# Patient Record
Sex: Female | Born: 2000 | Race: Black or African American | Hispanic: No | Marital: Single | Smoking: Never smoker
Health system: Southern US, Community
[De-identification: ages and names within clinical notes are randomized; demographics above are authoritative.]

## PROBLEM LIST (undated history)

## (undated) DIAGNOSIS — F419 Anxiety disorder, unspecified: Secondary | ICD-10-CM

## (undated) DIAGNOSIS — F329 Major depressive disorder, single episode, unspecified: Secondary | ICD-10-CM

## (undated) DIAGNOSIS — F32A Depression, unspecified: Secondary | ICD-10-CM

---
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
## 2020-05-20 ENCOUNTER — Emergency Department (HOSPITAL_COMMUNITY)
Admission: EM | Admit: 2020-05-20 | Discharge: 2020-05-20 | Disposition: A | Discharge status: Home / Self Care | Payer: BC Managed Care – PPO | Attending: Emergency Medicine | Admitting: Emergency Medicine

## 2020-05-20 ENCOUNTER — Encounter (HOSPITAL_COMMUNITY): Payer: Self-pay

## 2020-05-20 ENCOUNTER — Emergency Department (HOSPITAL_COMMUNITY): Payer: BC Managed Care – PPO

## 2020-05-20 ENCOUNTER — Other Ambulatory Visit: Payer: Self-pay

## 2020-05-20 DIAGNOSIS — T43011A Poisoning by tricyclic antidepressants, accidental (unintentional), initial encounter: Secondary | ICD-10-CM | POA: Diagnosis not present

## 2020-05-20 DIAGNOSIS — T43591A Poisoning by other antipsychotics and neuroleptics, accidental (unintentional), initial encounter: Secondary | ICD-10-CM | POA: Insufficient documentation

## 2020-05-20 DIAGNOSIS — R0602 Shortness of breath: Secondary | ICD-10-CM

## 2020-05-20 DIAGNOSIS — T50901A Poisoning by unspecified drugs, medicaments and biological substances, accidental (unintentional), initial encounter: Secondary | ICD-10-CM

## 2020-05-20 LAB — COMPREHENSIVE METABOLIC PANEL
ALT: 16 U/L (ref 0–44)
AST: 21 U/L (ref 15–41)
Albumin: 4 g/dL (ref 3.5–5.0)
Alkaline Phosphatase: 27 U/L — ABNORMAL LOW (ref 38–126)
Anion gap: 13 (ref 5–15)
BUN: 12 mg/dL (ref 6–20)
CO2: 19 mmol/L — ABNORMAL LOW (ref 22–32)
Calcium: 9 mg/dL (ref 8.9–10.3)
Chloride: 106 mmol/L (ref 98–111)
Creatinine, Ser: 0.65 mg/dL (ref 0.44–1.00)
GFR, Estimated: >60 mL/min (ref >60)
Glucose, Bld: 107 mg/dL — ABNORMAL HIGH (ref 70–99)
Potassium: 2.8 mmol/L — ABNORMAL LOW (ref 3.5–5.1)
Sodium: 138 mmol/L (ref 135–145)
Total Bilirubin: 1 mg/dL (ref 0.3–1.2)
Total Protein: 7.2 g/dL (ref 6.5–8.1)

## 2020-05-20 LAB — CBC
HCT: 36.5 % (ref 36.0–46.0)
Hemoglobin: 11.4 g/dL — ABNORMAL LOW (ref 12.0–15.0)
MCH: 26.6 pg (ref 26.0–34.0)
MCHC: 31.2 g/dL (ref 30.0–36.0)
MCV: 85.3 fL (ref 80.0–100.0)
Platelets: 228 10*3/uL (ref 150–400)
RBC: 4.28 MIL/uL (ref 3.87–5.11)
RDW: 13.1 % (ref 11.5–15.5)
WBC: 6 10*3/uL (ref 4.0–10.5)
nRBC: 0 % (ref 0.0–0.2)

## 2020-05-20 LAB — MAGNESIUM: Magnesium: 2.2 mg/dL (ref 1.7–2.4)

## 2020-05-20 LAB — I-STAT BETA HCG BLOOD, ED (MC, WL, AP ONLY): I-stat hCG, quantitative: <5 m[IU]/mL (ref <5)

## 2020-05-20 MED ORDER — ONDANSETRON HCL 4 MG/2ML IJ SOLN
4.0000 mg | Freq: Once | INTRAMUSCULAR | Status: AC
  Administered 2020-05-20: 4 mg via INTRAVENOUS
  Filled 2020-05-20: qty 2

## 2020-05-20 MED ORDER — ALUM & MAG HYDROXIDE-SIMETH 200-200-20 MG/5ML PO SUSP
30.0000 mL | Freq: Once | ORAL | Status: AC
  Administered 2020-05-20: 30 mL via ORAL
  Filled 2020-05-20: qty 30

## 2020-05-20 MED ORDER — LACTATED RINGERS IV BOLUS
1000.0000 mL | Freq: Once | INTRAVENOUS | Status: AC
  Administered 2020-05-20: 1000 mL via INTRAVENOUS

## 2020-05-20 MED ORDER — SODIUM CHLORIDE 0.9 % IV BOLUS
500.0000 mL | Freq: Once | INTRAVENOUS | Status: AC
  Administered 2020-05-20: 500 mL via INTRAVENOUS

## 2020-05-20 MED ORDER — POTASSIUM CHLORIDE 20 MEQ PO PACK
40.0000 meq | PACK | Freq: Two times a day (BID) | ORAL | Status: DC
  Administered 2020-05-20: 40 meq via ORAL
  Filled 2020-05-20: qty 2

## 2020-05-20 MED ORDER — LIDOCAINE VISCOUS HCL 2 % MT SOLN
15.0000 mL | Freq: Once | OROMUCOSAL | Status: AC
  Administered 2020-05-20: 15 mL via ORAL
  Filled 2020-05-20: qty 15

## 2020-05-20 MED ORDER — POTASSIUM CHLORIDE ER 10 MEQ PO TBCR: 10.0000 meq | EXTENDED_RELEASE_TABLET | Freq: Every day | ORAL | 0 refills | Status: DC

## 2020-05-20 NOTE — ED Triage Notes (Signed)
Pt reports she took 2 seroquel tablets and 6 50mg  trazodone tablets around 345pm today to try and help her sleep. Pt now c.o feeling like her heart is racing, is feeling dizzy and her stomach is burning. Pt denies SI.  Denise from poison control recommends and EKG now and a repeat in 4 hours. She also recommends checking a K+, Mg and tylenol level and administer benzos PRN aggitation.   Pt a.o at this time

## 2020-05-20 NOTE — ED Provider Notes (Signed)
Patient is a 19 year old female who took an intentional dose of medication including both trazodone and Seroquel to try to sleep because she has had insomnia and she is in the midst of finals for her college classes.  She then became dizzy lightheaded and is nauseated, on exam she is in good spirits well-appearing and alert, she moves all 4 extremities and has a nontender abdomen, heart and lung exams are unremarkable without tachycardia on my exam.  No edema, normal neurologic exam with level of alertness.  She will be kept on a monitor cardiac, needs repeat EKG as she does have slight prolonged QT interval.  This patient was critically ill requiring frequent reevaluation, cardiac monitoring, neuro checks and EKG monitoring, Poison control consultaiton and prolonged ED observation.   Thanksfully, the the EKG normalized and the pt was able to improved to the point of safety at d/c.    CRITICAL CARE Performed by: Vida Roller Total critical care time: 35 minutes Critical care time was exclusive of separately billable procedures and treating other patients. Critical care was necessary to treat or prevent imminent or life-threatening deterioration. Critical care was time spent personally by me on the following activities: development of treatment plan with patient and/or surrogate as well as nursing, discussions with consultants, evaluation of patient's response to treatment, examination of patient, obtaining history from patient or surrogate, ordering and performing treatments and interventions, ordering and review of laboratory studies, ordering and review of radiographic studies, pulse oximetry and re-evaluation of patient's condition.    I saw and evaluated the patient, reviewed the resident's note and I agree with the findings and plan.  I was personally present and directly supervised the following procedures:  Medical Resuscitation for Overdose  I personally interpreted the EKG as well as  the resident and agree with the interpretation on the resident's chart.  Final diagnoses:  Accidental drug overdose, initial encounter      Eber Hong, MD 05/22/20 1920

## 2020-05-20 NOTE — Discharge Instructions (Addendum)
Thank you for allowing [place] to take care of you.  Make sure you are taking your prescriptions as prescribed do not take more than what you are prescribed.  When you are unable to sleep, make sure you are practicing good sleep hygiene including turning off screens, making sure you are room/environment is dark, not having caffeine several hours before bed, etc. You can also try over-the-counter melatonin if you have not already tried this.  If you have any confusion, severe headache, chest pain, shortness of breath, severe abdominal pain, or other concerning symptoms, please return to the ER.  Your potassium was low so take extra potassium for the next week.  Take a multivitamin daily.  Follow-up with your PCP to recheck your potassium level.

## 2020-05-20 NOTE — ED Provider Notes (Signed)
[facility] EMERGENCY DEPARTMENT Provider Note   CSN: [id number] Arrival date & time: 05/20/20  1701     History Chief Complaint  Patient presents with  . Drug Overdose    Leslie Stanley is a 19 y.o. female with a history of anxiety and depression reports taking 6 trazodone and 2 Seroquel at 3:45 PM (about 2 hours ago) and attempt to sleep.  She states that she is in the middle of finals and needs to be awake later tonight so wanted to sleep during the day but struggles with insomnia.  She states she came into the ED because she started feeling dizzy (lightheaded and vertigo), shortness of breath, nausea, and palpitations/tachycardia.  Patient denies that this was a suicide attempt.  She denies any SI.  States her last suicide attempt was several years ago and she has not had any recently.  The history is provided by the patient.  Drug Overdose This is a new problem. The current episode started 1 to 2 hours ago. Episode frequency: once. The problem has been gradually worsening. Associated symptoms include abdominal pain (burning) and shortness of breath. Pertinent negatives include no chest pain. Nothing aggravates the symptoms. Nothing relieves the symptoms. She has tried nothing for the symptoms.       Past Medical History:  Diagnosis Date  . Anxiety   . Depression     Patient Active Problem List   Diagnosis Date Noted  . MDD (major depressive disorder), severe (HCC) 03/20/2018    History reviewed. No pertinent surgical history.   OB History   No obstetric history on file.     No family history on file.  Social History   Tobacco Use  . Smoking status: Never Smoker  . Smokeless tobacco: Never Used  . Tobacco comment: no cessation material needed  Vaping Use  . Vaping Use: Never used  Substance Use Topics  . Alcohol use: Never  . Drug use: Not Currently    Types: Marijuana    Home Medications Prior to Admission medications   Medication Sig  Start Date End Date Taking? Authorizing Provider  ASHLYNA 0.15-0.03 &0.01 MG tablet Take 1 tablet by mouth daily. 04/19/20  Yes [provider]  QUEtiapine Fumarate (SEROQUEL PO) Take 2 tablets by mouth every other day.   Yes [provider]  traZODone (DESYREL) 50 MG tablet Take 1 tablet (50 mg total) by mouth at bedtime and may repeat dose one time if needed. Patient taking differently: Take 50 mg by mouth as needed for sleep.  03/23/18  Yes Money, Gerlene Burdock, FNP  citalopram (CELEXA) 10 MG tablet Take 1 tablet (10 mg total) by mouth daily. Mood control Patient not taking: Reported on 05/20/2020 03/24/18   Money, Gerlene Burdock, FNP  hydrOXYzine (ATARAX/VISTARIL) 25 MG tablet Take 1 tablet (25 mg total) by mouth every 6 (six) hours as needed for anxiety. Patient not taking: Reported on 05/20/2020 03/23/18   Money, Gerlene Burdock, FNP  potassium chloride (KLOR-CON) 10 MEQ tablet Take 1 tablet (10 mEq total) by mouth daily for 7 days. 05/20/20 05/27/20  Gershon Mussel, MD    Allergies    Lactase  Review of Systems   Review of Systems  Constitutional: Positive for fatigue. Negative for chills and fever.  HENT: Negative for ear pain and sore throat.   Eyes: Negative for pain and visual disturbance.  Respiratory: Positive for shortness of breath. Negative for cough.   Cardiovascular: Positive for palpitations. Negative for chest pain.  Gastrointestinal: Positive for abdominal pain (burning). Negative for vomiting.  Genitourinary: Negative for dysuria and hematuria.  Musculoskeletal: Negative for arthralgias and back pain.  Skin: Negative for color change and rash.  Neurological: Positive for dizziness (Lightheadedness, vertigo). Negative for seizures and syncope.  Psychiatric/Behavioral: Positive for sleep disturbance (Insomnia). Negative for self-injury and suicidal ideas.  All other systems reviewed and are negative.   Physical Exam Updated Vital Signs BP (!) 102/52   Pulse 79    Temp 98 F (36.7 C) (Oral)   Resp 15   SpO2 99%   Physical Exam Vitals and nursing note reviewed.  Constitutional:      General: She is not in acute distress.    Appearance: Normal appearance. She is well-developed. She is not ill-appearing or toxic-appearing.  HENT:     Head: Normocephalic and atraumatic.     Right Ear: External ear normal.     Left Ear: External ear normal.     Nose: Nose normal. No rhinorrhea.     Mouth/Throat:     Mouth: Mucous membranes are moist.     Pharynx: No oropharyngeal exudate or posterior oropharyngeal erythema.  Eyes:     Extraocular Movements: Extraocular movements intact.     Conjunctiva/sclera: Conjunctivae normal.     Pupils: Pupils are equal, round, and reactive to light.  Cardiovascular:     Rate and Rhythm: Normal rate and regular rhythm.     Pulses: Normal pulses.     Heart sounds: No murmur heard.  No friction rub. No gallop.   Pulmonary:     Effort: Pulmonary effort is normal. No respiratory distress.     Breath sounds: Normal breath sounds.  Abdominal:     General: There is no distension.     Palpations: Abdomen is soft.     Tenderness: There is no abdominal tenderness.  Musculoskeletal:        General: No swelling, tenderness, deformity or signs of injury. Normal range of motion.     Cervical back: Neck supple.     Right lower leg: No edema.     Left lower leg: No edema.  Skin:    General: Skin is warm and dry.  Neurological:     General: No focal deficit present.     Mental Status: She is alert and oriented to person, place, and time. Mental status is at baseline.     Cranial Nerves: No cranial nerve deficit.     Sensory: No sensory deficit.     Motor: No weakness.     Coordination: Coordination normal.     Gait: Gait normal.     Comments: Drowsy-appearing but awake, conversant  Psychiatric:        Mood and Affect: Mood normal.        Behavior: Behavior normal.     ED Results / Procedures / Treatments   Labs (all  labs ordered are listed, but only abnormal results are displayed) Labs Reviewed  COMPREHENSIVE METABOLIC PANEL - Abnormal; Notable for the following components:      Result Value   Potassium 2.8 (*)    CO2 19 (*)    Glucose, Bld 107 (*)    Alkaline Phosphatase 27 (*)    All other components within normal limits  CBC - Abnormal; Notable for the following components:   Hemoglobin 11.4 (*)    All other components within normal limits  MAGNESIUM  ETHANOL  SALICYLATE LEVEL  ACETAMINOPHEN LEVEL  I-STAT BETA HCG BLOOD, ED ([facility], [facility],  [facility] ONLY)    EKG EKG Interpretation  Date/Time:  Monday May 20 2020 17:09:11 EST Ventricular Rate:  110 PR Interval:  124 QRS Duration: 84 QT Interval:  380 QTC Calculation: 514 R Axis:   166 Text Interpretation: Sinus tachycardia Right atrial enlargement Right axis deviation Cannot rule out Anterior infarct , age undetermined Abnormal ECG Since last tracing QT prolonged, rate faster Confirmed by Eber Hong ([id number]) on 05/20/2020 5:40:08 PM   Radiology DG Chest Port 1 View  Result Date: 05/20/2020 CLINICAL DATA:  Shortness of breath, drug overdose EXAM: PORTABLE CHEST 1 VIEW COMPARISON:  None. FINDINGS: No consolidation, features of edema, pneumothorax, or effusion. Pulmonary vascularity is normally distributed. The cardiomediastinal contours are unremarkable. No acute osseous or soft tissue abnormality. Telemetry leads overlie the chest. IMPRESSION: No acute cardiopulmonary abnormality. Electronically Signed   By: Kreg Shropshire M.D.   On: 05/20/2020 18:27    Procedures Procedures (including critical care time)  Medications Ordered in ED Medications  potassium chloride (KLOR-CON) packet 40 mEq (40 mEq Oral Given 05/20/20 2234)  lactated ringers bolus 1,000 mL (0 mLs Intravenous Stopped 05/20/20 2216)  ondansetron (ZOFRAN) injection 4 mg (4 mg Intravenous Given 05/20/20 1856)  alum & mag hydroxide-simeth (MAALOX/MYLANTA) 200-200-20 MG/5ML  suspension 30 mL (30 mLs Oral Given 05/20/20 1851)    And  lidocaine (XYLOCAINE) 2 % viscous mouth solution 15 mL (15 mLs Oral Given 05/20/20 1851)  sodium chloride 0.9 % bolus 500 mL (0 mLs Intravenous Stopped 05/20/20 2216)    ED Course  I have reviewed the triage vital signs and the nursing notes.  Pertinent labs & imaging results that were available during my care of the patient were reviewed by me and considered in my medical decision making (see chart for details).    MDM Rules/Calculators/A&P                          MDM: Terriann Difonzo is a 19 y.o. female who presents with drug ingestion as per above. I have reviewed the nursing documentation for past medical history, family history, and social history. Pertinent previous records reviewed. She is awake, alert. Initially tachy but improved spontaneously, now HDS. Afebrile. Physical exam is most notable for drowsy but awake 71-year-old female with normal neuro exam, normal gait.  Labs: hCG negative.  Mag 2.2.  Potassium 2.8, bicarb 19.  CBC unremarkable. EKG: EKG 1.5 hours after ingestion with sinus tachycardia with ventricular rate of 110, QRS 84, QTc prolonged at 514, right axis deviation.  Repeat EKG 6.5 hours after ingestion with sinus tachycardia, QRS 82, QTc 452, right axis deviation. Imaging: CXR demonstrating with no acute cardiopulmonary abnormality. Consults:  poison control by phone Tx: 1 L LR, 4 mg IV Zofran, Maalox, viscous lidocaine, 40 mEq p.o. potassium  Differential Dx: I am most concerned for unintentional drug overdose. Given history, physical exam, and work-up, I do not think she has suicide attempt, SI, HI, hallucinations, altered mental status, stroke, other drug ingestion, intoxication, arrhythmia, ACS, PE, pneumonia, or trauma.  MDM: Johana Hopkinson is a 19 y.o. female presents after taking 2 Seroquel and 6 trazodone and an effort to sleep but then experienced dizziness, nausea, shortness of breath, and a  burning chest pain prompting her to present to the ED.  Per [place] poison control, recommended 6-hour observation time from time of ingestion and until patient is asymptomatic/at baseline.  Would also recommend a repeat EKG in 4 hours, Tylenol level 4  hours after ingestion, and maximizing potassium and mag ("high side a normal").   Potassium resulted low so patient was given p.o. potassium as well as a prescription for oral potassium on discharge.  Patient also encouraged to take multivitamin and recommended close PCP follow-up for repeat lab work to check her potassium.  Patient voiced understanding.  Patient denies any other drug ingestions and I do not feel that Tylenol, ethanol, salicylate, or UDS lab work would be beneficial at this time. Bicarb 19 but without any anion gap, given 1 L LR for this.  On reassessment, patient stated that she felt significantly better and would like to be discharged home.  I felt this is reasonable.  Patient ambulated again just prior to discharge with normal gait.  Awake, alert, no longer drowsy.  Normal mental status.  Stable for discharge home.  Strict return precautions provided. Encouraged her to follow-up with her PCP on an outpatient basis. Questions were answered.  Patient discharged in stable condition.  The plan for this patient was discussed with Dr. Hyacinth Meeker, who voiced agreement and who oversaw evaluation and treatment of this patient.   Final Clinical Impression(s) / ED Diagnoses Final diagnoses:  Accidental drug overdose, initial encounter    Rx / DC Orders ED Discharge Orders         Ordered    potassium chloride (KLOR-CON) 10 MEQ tablet  Daily        05/20/20 2231           Gershon Mussel, MD 05/20/20 0102    Eber Hong, MD 05/22/20 1918

## 2020-05-21 ENCOUNTER — Emergency Department (HOSPITAL_COMMUNITY): Payer: BC Managed Care – PPO

## 2020-05-21 ENCOUNTER — Other Ambulatory Visit: Payer: Self-pay

## 2020-05-21 ENCOUNTER — Observation Stay (HOSPITAL_COMMUNITY)
Admission: EM | Admit: 2020-05-21 | Discharge: 2020-05-23 | Disposition: A | Discharge status: Home / Self Care | Payer: BC Managed Care – PPO | Attending: Family Medicine | Admitting: Family Medicine

## 2020-05-21 ENCOUNTER — Encounter (HOSPITAL_COMMUNITY): Payer: Self-pay | Admitting: Emergency Medicine

## 2020-05-21 DIAGNOSIS — Z20822 Contact with and (suspected) exposure to covid-19: Secondary | ICD-10-CM | POA: Insufficient documentation

## 2020-05-21 DIAGNOSIS — R000 Tachycardia, unspecified: Secondary | ICD-10-CM | POA: Insufficient documentation

## 2020-05-21 DIAGNOSIS — R4182 Altered mental status, unspecified: Secondary | ICD-10-CM | POA: Diagnosis present

## 2020-05-21 DIAGNOSIS — T50904D Poisoning by unspecified drugs, medicaments and biological substances, undetermined, subsequent encounter: Secondary | ICD-10-CM

## 2020-05-21 DIAGNOSIS — Z79899 Other long term (current) drug therapy: Secondary | ICD-10-CM | POA: Diagnosis not present

## 2020-05-21 DIAGNOSIS — F323 Major depressive disorder, single episode, severe with psychotic features: Secondary | ICD-10-CM | POA: Insufficient documentation

## 2020-05-21 DIAGNOSIS — T43501D Poisoning by unspecified antipsychotics and neuroleptics, accidental (unintentional), subsequent encounter: Secondary | ICD-10-CM | POA: Diagnosis not present

## 2020-05-21 DIAGNOSIS — F322 Major depressive disorder, single episode, severe without psychotic features: Secondary | ICD-10-CM | POA: Diagnosis present

## 2020-05-21 DIAGNOSIS — T43211D Poisoning by selective serotonin and norepinephrine reuptake inhibitors, accidental (unintentional), subsequent encounter: Principal | ICD-10-CM | POA: Insufficient documentation

## 2020-05-21 DIAGNOSIS — T50901A Poisoning by unspecified drugs, medicaments and biological substances, accidental (unintentional), initial encounter: Secondary | ICD-10-CM | POA: Diagnosis present

## 2020-05-21 LAB — T4, FREE: Free T4: 0.84 ng/dL (ref 0.61–1.12)

## 2020-05-21 LAB — COMPREHENSIVE METABOLIC PANEL
ALT: 17 U/L (ref 0–44)
AST: 21 U/L (ref 15–41)
Albumin: 4.3 g/dL (ref 3.5–5.0)
Alkaline Phosphatase: 24 U/L — ABNORMAL LOW (ref 38–126)
Anion gap: 15 (ref 5–15)
BUN: 12 mg/dL (ref 6–20)
CO2: 19 mmol/L — ABNORMAL LOW (ref 22–32)
Calcium: 9.3 mg/dL (ref 8.9–10.3)
Chloride: 104 mmol/L (ref 98–111)
Creatinine, Ser: 0.84 mg/dL (ref 0.44–1.00)
GFR, Estimated: >60 mL/min (ref >60)
Glucose, Bld: 88 mg/dL (ref 70–99)
Potassium: 3.6 mmol/L (ref 3.5–5.1)
Sodium: 138 mmol/L (ref 135–145)
Total Bilirubin: 1.2 mg/dL (ref 0.3–1.2)
Total Protein: 7.4 g/dL (ref 6.5–8.1)

## 2020-05-21 LAB — CBC
HCT: 38.8 % (ref 36.0–46.0)
Hemoglobin: 12.1 g/dL (ref 12.0–15.0)
MCH: 26.4 pg (ref 26.0–34.0)
MCHC: 31.2 g/dL (ref 30.0–36.0)
MCV: 84.5 fL (ref 80.0–100.0)
Platelets: 218 10*3/uL (ref 150–400)
RBC: 4.59 MIL/uL (ref 3.87–5.11)
RDW: 13.3 % (ref 11.5–15.5)
WBC: 7.1 10*3/uL (ref 4.0–10.5)
nRBC: 0 % (ref 0.0–0.2)

## 2020-05-21 LAB — ACETAMINOPHEN LEVEL: Acetaminophen (Tylenol), Serum: <10 ug/mL — ABNORMAL LOW (ref 10–30)

## 2020-05-21 LAB — SALICYLATE LEVEL: Salicylate Lvl: <7 mg/dL — ABNORMAL LOW (ref 7.0–30.0)

## 2020-05-21 LAB — MAGNESIUM: Magnesium: 2.3 mg/dL (ref 1.7–2.4)

## 2020-05-21 LAB — TSH: TSH: 0.803 u[IU]/mL (ref 0.350–4.500)

## 2020-05-21 LAB — ETHANOL: Alcohol, Ethyl (B): <10 mg/dL (ref <10)

## 2020-05-21 MED ORDER — LORAZEPAM 2 MG/ML IJ SOLN
1.0000 mg | Freq: Once | INTRAMUSCULAR | Status: AC
  Administered 2020-05-21: 1 mg via INTRAVENOUS
  Filled 2020-05-21: qty 1

## 2020-05-21 MED ORDER — SODIUM CHLORIDE 0.9 % IV BOLUS
1000.0000 mL | Freq: Once | INTRAVENOUS | Status: AC
  Administered 2020-05-21: 1000 mL via INTRAVENOUS

## 2020-05-21 NOTE — ED Triage Notes (Addendum)
Patient arrives to ED via [facility] to [facility] with HR of 160. EMS and pts mother reported pt taking unknown amount of Seroquel and Trazodone today. Pt also took these drugs yesterday and was treated in this ED. Pts mom called EMS. Pt states that her heart is racing and she feels jittery. Pt states she denies SI and took it because of school finals and she wanted to get some sleep.

## 2020-05-21 NOTE — ED Provider Notes (Addendum)
[facility] EMERGENCY DEPARTMENT Provider Note   CSN: [id number] Arrival date & time: 05/21/20  1458    History Chief Complaint  Patient presents with  . Ingestion    Zelina Flannagan is a 19 y.o. female with past history significant for anxiety, depression who presents for evaluation of "feeling odd."  Patient was seen yesterday after intentional overdose of trazodone and Seroquel because she has not been sleeping.  She denied any intent at self-harm, HI or AVH.  She was discharged home after being observed here in the emergency department.  Patient states she is wanted to sleep but she has not been able to due to finals for school.  On arrival here patient has no complaint.  She is tachycardic.  He does admit to a tingling sensation to her face, bilateral upper and lower extremities.  States she will occasionally feel palpitations and lightheaded.  Does admit to using marijuana earlier today however denies any additional medications. She states she is not take anything else to sleep.  Denies SI, HI, AVH.  She does appear slightly altered and confused.  Patient states "I do not know why you are asking me all these questions."   Collateral from patient's mother Jerusalen Mateja at [phone].  States patient has been acting abnormal over the last 2 days worsening today. She called again to check on her daughter today and she had slurred speech, "talking in circles."  Her mother states patient is not the type to use alcohol or drugs.  Did note that she took medications yesterday to sleep and was seen here in the emergency department and subsequently discharged home.  Mother states her mentation has declined since being discharged from the ED. Mother states patient does have a baseline mild stutter.  Level 5 Caveat- Altered Mental status   HPI     Past Medical History:  Diagnosis Date  . Anxiety   . Depression     Patient Active Problem List   Diagnosis Date Noted  .  Accidental drug overdose 05/21/2020  . MDD (major depressive disorder), severe (HCC) 03/20/2018    History reviewed. No pertinent surgical history.   OB History   No obstetric history on file.     History reviewed. No pertinent family history.  Social History   Tobacco Use  . Smoking status: Never Smoker  . Smokeless tobacco: Never Used  . Tobacco comment: no cessation material needed  Vaping Use  . Vaping Use: Never used  Substance Use Topics  . Alcohol use: Never  . Drug use: Not Currently    Types: Marijuana    Home Medications Prior to Admission medications   Medication Sig Start Date End Date Taking? Authorizing Provider  ASHLYNA 0.15-0.03 &0.01 MG tablet Take 1 tablet by mouth daily. 04/19/20  Yes [provider]  QUEtiapine Fumarate (SEROQUEL PO) Take 2 tablets by mouth every other day.   Yes [provider]  traZODone (DESYREL) 50 MG tablet Take 1 tablet (50 mg total) by mouth at bedtime and may repeat dose one time if needed. Patient taking differently: Take 50 mg by mouth as needed for sleep.  03/23/18  Yes Money, Gerlene Burdock, FNP  citalopram (CELEXA) 10 MG tablet Take 1 tablet (10 mg total) by mouth daily. Mood control Patient not taking: Reported on 05/20/2020 03/24/18   Money, Gerlene Burdock, FNP  hydrOXYzine (ATARAX/VISTARIL) 25 MG tablet Take 1 tablet (25 mg total) by mouth every 6 (six) hours as needed for anxiety. Patient  not taking: Reported on 05/20/2020 03/23/18   Money, Gerlene Burdock, FNP  potassium chloride (KLOR-CON) 10 MEQ tablet Take 1 tablet (10 mEq total) by mouth daily for 7 days. Patient not taking: Reported on 05/21/2020 05/20/20 05/27/20  Gershon Mussel, MD    Allergies    Lactase  Review of Systems   Review of Systems  Unable to perform ROS: Mental status change  Constitutional: Negative.   HENT: Negative.   Respiratory: Negative.   Cardiovascular: Negative.   Gastrointestinal: Negative.   Genitourinary: Negative.     Musculoskeletal: Negative.   Skin: Negative.   Neurological: Positive for light-headedness and numbness.  Psychiatric/Behavioral: Positive for agitation, behavioral problems, confusion, hallucinations and sleep disturbance. The patient is nervous/anxious.   All other systems reviewed and are negative.  Physical Exam Updated Vital Signs BP 127/83   Pulse (!) 123   Temp 98.5 F (36.9 C) (Oral)   Resp 18   Ht 5' 2.5" (1.588 m)   Wt 51.7 kg   SpO2 100%   BMI 20.52 kg/m   Physical Exam Vitals and nursing note reviewed.  Constitutional:      General: She is not in acute distress.    Appearance: She is well-developed. She is not ill-appearing, toxic-appearing or diaphoretic.     Comments: Falls asleep mid sentence  HENT:     Head: Normocephalic and atraumatic. No raccoon eyes or Battle's sign.     Jaw: There is normal jaw occlusion.     Mouth/Throat:     Lips: Pink.     Mouth: Mucous membranes are dry.     Pharynx: Oropharynx is clear.     Comments: Dry mucous membranes Eyes:     Extraocular Movements: Extraocular movements intact.     Conjunctiva/sclera: Conjunctivae normal.     Pupils: Pupils are equal, round, and reactive to light.     Comments: EOM intact. No nystagmus.  Neck:     Trachea: Trachea normal.     Meningeal: Brudzinski's sign and Kernig's sign absent.     Comments: No neck stiffness or neck rigidity Cardiovascular:     Rate and Rhythm: Tachycardia present.     Pulses: Normal pulses.          Radial pulses are 2+ on the right side and 2+ on the left side.       Dorsalis pedis pulses are 2+ on the right side and 2+ on the left side.     Heart sounds: Normal heart sounds.     Comments: No murmur Pulmonary:     Effort: Pulmonary effort is normal. No respiratory distress.     Breath sounds: Normal breath sounds and air entry.     Comments: Clear to auscultation  Chest:     Comments: Equal rise and fall to chest wall Abdominal:     General: Bowel sounds  are normal. There is no distension.     Tenderness: There is no abdominal tenderness. There is no right CVA tenderness, left CVA tenderness or guarding.     Comments: Soft non tender without rebound or guarding  Musculoskeletal:        General: Normal range of motion.     Cervical back: Full passive range of motion without pain and normal range of motion.     Comments: Moves all 4 extremities without difficulty. Compartments soft. No bony tenderness  Skin:    General: Skin is warm and dry.     Capillary Refill: Capillary refill takes 2 to  3 seconds.     Comments: Sweaty palms   Neurological:     Mental Status: She is disoriented and confused.     Cranial Nerves: Cranial nerves are intact.     Sensory: Sensation is intact.     Motor: No tremor or seizure activity.     Gait: Gait is intact.     Comments: Refuses to perform neuro exam however no facial droop. Moves all 4 extremities without difficulty. Follows commands. Stuttering speech and slurred. Intermittent agitation. Patient pulls away when attempting refluxes, no obvious muscle rigidity. No clonus. Ambulatory in ED without difficulty  Psychiatric:        Behavior: Behavior is agitated.     Comments: Looks around room, question if pt is hallucinating? Denies SI, HI, AVH    ED Results / Procedures / Treatments   Labs (all labs ordered are listed, but only abnormal results are displayed) Labs Reviewed  COMPREHENSIVE METABOLIC PANEL - Abnormal; Notable for the following components:      Result Value   CO2 19 (*)    Alkaline Phosphatase 24 (*)    All other components within normal limits  SALICYLATE LEVEL - Abnormal; Notable for the following components:   Salicylate Lvl <7.0 (*)    All other components within normal limits  ACETAMINOPHEN LEVEL - Abnormal; Notable for the following components:   Acetaminophen (Tylenol), Serum <10 (*)    All other components within normal limits  RESP PANEL BY RT-PCR (FLU A&B, COVID) ARPGX2   MAGNESIUM  CBC  ETHANOL  TSH  T4, FREE  RAPID URINE DRUG SCREEN, HOSP PERFORMED  URINALYSIS, ROUTINE W REFLEX MICROSCOPIC  CK  LACTIC ACID, PLASMA    EKG EKG Interpretation  Date/Time:  Tuesday May 21 2020 17:36:26 EST Ventricular Rate:  119 PR Interval:  128 QRS Duration: 91 QT Interval:  319 QTC Calculation: 449 R Axis:   131 Text Interpretation: Sinus tachycardia Biatrial enlargement Right axis deviation Borderline T wave abnormalities Borderline ST elevation, lateral leads Baseline wander in lead(s) V1 No significant change since last tracing Confirmed by Gwyneth Sprout ([id number]) on 05/21/2020 6:00:26 PM   Radiology DG Chest 2 View  Result Date: 05/21/2020 CLINICAL DATA:  Altered mental status, tachycardia, patient consumed unknown dose of Seroquel and trazodone EXAM: CHEST - 2 VIEW COMPARISON:  Radiograph 05/20/2020 FINDINGS: Some streaky opacities are present in the lung bases favoring atelectatic change though given altered mental status aspiration is not fully excluded. No focal consolidation, convincing edema, pneumothorax or effusion. The cardiomediastinal contours are unremarkable. No acute osseous or soft tissue abnormality. Telemetry leads overlie the chest. IMPRESSION: Streaky opacities in the lung bases favoring atelectatic change though given altered mental status aspiration is not fully excluded. Electronically Signed   By: Kreg Shropshire M.D.   On: 05/21/2020 21:48   CT Head Wo Contrast  Result Date: 05/21/2020 CLINICAL DATA:  Recent medication ingestion with delirium, initial encounter EXAM: CT HEAD WITHOUT CONTRAST TECHNIQUE: Contiguous axial images were obtained from the base of the skull through the vertex without intravenous contrast. COMPARISON:  None. FINDINGS: Brain: No evidence of acute infarction, hemorrhage, hydrocephalus, extra-axial collection or mass lesion/mass effect. Vascular: No hyperdense vessel or unexpected calcification. Skull: Normal.  Negative for fracture or focal lesion. Sinuses/Orbits: No acute finding. Other: None. IMPRESSION: No acute intracranial abnormality noted. Electronically Signed   By: Alcide Clever M.D.   On: 05/21/2020 21:52   DG Chest Port 1 View  Result Date: 05/20/2020 CLINICAL DATA:  Shortness  of breath, drug overdose EXAM: PORTABLE CHEST 1 VIEW COMPARISON:  None. FINDINGS: No consolidation, features of edema, pneumothorax, or effusion. Pulmonary vascularity is normally distributed. The cardiomediastinal contours are unremarkable. No acute osseous or soft tissue abnormality. Telemetry leads overlie the chest. IMPRESSION: No acute cardiopulmonary abnormality. Electronically Signed   By: Kreg Shropshire M.D.   On: 05/20/2020 18:27    Procedures .Critical Care Performed by: Linwood Dibbles, PA-C Authorized by: Linwood Dibbles, PA-C   Critical care provider statement:    Critical care time (minutes):  35   Critical care was necessary to treat or prevent imminent or life-threatening deterioration of the following conditions:  Toxidrome   Critical care was time spent personally by me on the following activities:  Discussions with consultants, evaluation of patient's response to treatment, examination of patient, ordering and performing treatments and interventions, ordering and review of laboratory studies, ordering and review of radiographic studies, pulse oximetry, re-evaluation of patient's condition, obtaining history from patient or surrogate and review of old charts   (including critical care time)  Medications Ordered in ED Medications  sodium chloride 0.9 % bolus 1,000 mL (0 mLs Intravenous Stopped 05/21/20 2136)  LORazepam (ATIVAN) injection 1 mg (1 mg Intravenous Given 05/21/20 2329)   ED Course  I have reviewed the triage vital signs and the nursing notes.  Pertinent labs & imaging results that were available during my care of the patient were reviewed by me and considered in my medical  decision making (see chart for details).  19 year old presents for AMS. Afebrile, non septic, non ill appearing. Vague with answering question about amount of medication use. Does appear altered on exam. Stuttering, patient sleepy. Seen yesterday for overdose on serotonin and trazodone.  DC home at that time.  On arrival she is tachycardic with EKG showing sinus tachycardia.  Patient denies any chest pain, shortness of breath.  No clinical evidence of DVT on exam.  I have low suspicion for acute ACS, PE, dissection as cause of her tachycardia.  She is intermittently agitated however quickly falls asleep.  Intermittently cooperative for neuro exam.  She has no neck stiffness or neck rigidity.  She has no meningismus.  Low suspicion for meningitis. Does not appear to have infectious source of AMS. We will plan on labs, imaging and reassess.  Labs and imaging personally reviewed and interpreted:  CBC without leukocytosis  Metabolic panel without electrolyte, renal or abnormality Acetaminophen, salicylate, ethanol within normal limits TSH, free T4 within normal limits CT head without acute abnormality EKG without acute ischemic changes, sinus tachycardia DG chest without acute infiltrates, cardiomegaly, pulmonary, pneumothorax  Patient reassessed. Still extremely sleepy and falls asleep on exam. Will plan on add TSH, CT head.  Patient reassessed. Intermittently agitated, however quickly falls asleep. Naked on exam and refuses to put gown on. No clonus on exam, unable to obtain reflexes 2/2 patient agitation. Given recent OD yesterday and does have elevated blood pressures compared to previous visits, patient has nonsensical speech talks about a "box office" and then states "needs tanning products." Given constellation of symptoms, Question serotonin syndrome? Does not appear septic or to have infectious process on exam.  Patient continues to have tachycardia despite IV fluids.  No arrhythmias on  monitoring.  Will attempt Ativan.  Will need admission and monitoring and further work-up for altered mental status with questionable serotonin syndrome.  CONSULT with Dr. Leafy Half with [facility] who will evaluate patient for admission.  The patient appears reasonably  stabilized for admission considering the current resources, flow, and capabilities available in the ED at this time, and I doubt any other [facility] requiring further screening and/or treatment in the ED prior to admission.  Patient seen eval by attending, Dr. Anitra LauthPlunkett who agrees with treatment, plan and disposition.     MDM Rules/Calculators/A&P                           Final Clinical Impression(s) / ED Diagnoses Final diagnoses:  Altered mental status, unspecified altered mental status type  Tachycardia  Drug overdose, undetermined intent, subsequent encounter    Rx / DC Orders ED Discharge Orders    None       Khalilah Hoke A, PA-C 05/21/20 2338    Garlene Apperson A, PA-C 05/21/20 2340    Gwyneth SproutPlunkett, [place], MD 05/26/20 [phone]

## 2020-05-21 NOTE — ED Notes (Addendum)
Pt walked out of triage room stating that she did not take anything today and EMS and her mother doesn't know what they are talking about. Pt currently slurring her words and pacing triage room.

## 2020-05-21 NOTE — ED Notes (Signed)
Patient transported to CT 

## 2020-05-22 ENCOUNTER — Encounter (HOSPITAL_COMMUNITY): Payer: Self-pay | Admitting: Internal Medicine

## 2020-05-22 DIAGNOSIS — F322 Major depressive disorder, single episode, severe without psychotic features: Secondary | ICD-10-CM

## 2020-05-22 DIAGNOSIS — T50901A Poisoning by unspecified drugs, medicaments and biological substances, accidental (unintentional), initial encounter: Secondary | ICD-10-CM

## 2020-05-22 LAB — CBC WITH DIFFERENTIAL/PLATELET
Abs Immature Granulocytes: 0.02 10*3/uL (ref 0.00–0.07)
Basophils Absolute: 0 10*3/uL (ref 0.0–0.1)
Basophils Relative: 1 %
Eosinophils Absolute: 0 10*3/uL (ref 0.0–0.5)
Eosinophils Relative: 0 %
HCT: 33.9 % — ABNORMAL LOW (ref 36.0–46.0)
Hemoglobin: 11 g/dL — ABNORMAL LOW (ref 12.0–15.0)
Immature Granulocytes: 0 %
Lymphocytes Relative: 25 %
Lymphs Abs: 1.8 10*3/uL (ref 0.7–4.0)
MCH: 27.3 pg (ref 26.0–34.0)
MCHC: 32.4 g/dL (ref 30.0–36.0)
MCV: 84.1 fL (ref 80.0–100.0)
Monocytes Absolute: 0.8 10*3/uL (ref 0.1–1.0)
Monocytes Relative: 11 %
Neutro Abs: 4.6 10*3/uL (ref 1.7–7.7)
Neutrophils Relative %: 63 %
Platelets: 204 10*3/uL (ref 150–400)
RBC: 4.03 MIL/uL (ref 3.87–5.11)
RDW: 13.2 % (ref 11.5–15.5)
WBC: 7.3 10*3/uL (ref 4.0–10.5)
nRBC: 0 % (ref 0.0–0.2)

## 2020-05-22 LAB — COMPREHENSIVE METABOLIC PANEL
ALT: 15 U/L (ref 0–44)
AST: 17 U/L (ref 15–41)
Albumin: 3.7 g/dL (ref 3.5–5.0)
Alkaline Phosphatase: 23 U/L — ABNORMAL LOW (ref 38–126)
Anion gap: 12 (ref 5–15)
BUN: 9 mg/dL (ref 6–20)
CO2: 20 mmol/L — ABNORMAL LOW (ref 22–32)
Calcium: 8.7 mg/dL — ABNORMAL LOW (ref 8.9–10.3)
Chloride: 107 mmol/L (ref 98–111)
Creatinine, Ser: 0.73 mg/dL (ref 0.44–1.00)
GFR, Estimated: >60 mL/min (ref >60)
Glucose, Bld: 81 mg/dL (ref 70–99)
Potassium: 3.4 mmol/L — ABNORMAL LOW (ref 3.5–5.1)
Sodium: 139 mmol/L (ref 135–145)
Total Bilirubin: 1.1 mg/dL (ref 0.3–1.2)
Total Protein: 6.4 g/dL — ABNORMAL LOW (ref 6.5–8.1)

## 2020-05-22 LAB — MAGNESIUM: Magnesium: 2 mg/dL (ref 1.7–2.4)

## 2020-05-22 LAB — URINALYSIS, ROUTINE W REFLEX MICROSCOPIC
Bilirubin Urine: NEGATIVE
Glucose, UA: NEGATIVE mg/dL
Ketones, ur: 20 mg/dL — AB
Leukocytes,Ua: NEGATIVE
Nitrite: NEGATIVE
Protein, ur: NEGATIVE mg/dL
Specific Gravity, Urine: 1.012 (ref 1.005–1.030)
pH: 7 (ref 5.0–8.0)

## 2020-05-22 LAB — CK: Total CK: 97 U/L (ref 38–234)

## 2020-05-22 LAB — RESP PANEL BY RT-PCR (FLU A&B, COVID) ARPGX2
Influenza A by PCR: NEGATIVE
Influenza B by PCR: NEGATIVE
SARS Coronavirus 2 by RT PCR: NEGATIVE

## 2020-05-22 LAB — PHOSPHORUS: Phosphorus: 3.1 mg/dL (ref 2.5–4.6)

## 2020-05-22 LAB — LACTIC ACID, PLASMA: Lactic Acid, Venous: 0.9 mmol/L (ref 0.5–1.9)

## 2020-05-22 LAB — HIV ANTIBODY (ROUTINE TESTING W REFLEX): HIV Screen 4th Generation wRfx: NONREACTIVE

## 2020-05-22 MED ORDER — ACETAMINOPHEN 650 MG RE SUPP: 650.0000 mg | Freq: Four times a day (QID) | RECTAL | Status: DC | PRN

## 2020-05-22 MED ORDER — LACTATED RINGERS IV BOLUS
1000.0000 mL | Freq: Once | INTRAVENOUS | Status: AC
  Administered 2020-05-22: 1000 mL via INTRAVENOUS

## 2020-05-22 MED ORDER — ONDANSETRON HCL 4 MG/2ML IJ SOLN: 4.0000 mg | Freq: Four times a day (QID) | INTRAMUSCULAR | Status: DC | PRN

## 2020-05-22 MED ORDER — ENOXAPARIN SODIUM 40 MG/0.4ML ~~LOC~~ SOLN: 40.0000 mg | SUBCUTANEOUS | Status: DC

## 2020-05-22 MED ORDER — ACETAMINOPHEN 325 MG PO TABS: 650.0000 mg | ORAL_TABLET | Freq: Four times a day (QID) | ORAL | Status: DC | PRN

## 2020-05-22 MED ORDER — LACTATED RINGERS IV SOLN: INTRAVENOUS | Status: AC

## 2020-05-22 MED ORDER — ONDANSETRON HCL 4 MG PO TABS: 4.0000 mg | ORAL_TABLET | Freq: Four times a day (QID) | ORAL | Status: DC | PRN

## 2020-05-22 MED ORDER — POTASSIUM CHLORIDE CRYS ER 20 MEQ PO TBCR: 40.0000 meq | EXTENDED_RELEASE_TABLET | Freq: Once | ORAL | Status: DC

## 2020-05-22 NOTE — H&P (Addendum)
History and Physical    Leslie Stanley IZT:[id number] DOB: 2001/03/21 DOA: 05/21/2020  PCP: Pcp, No  Patient coming from: Home   Chief Complaint:  Chief Complaint  Patient presents with  . Ingestion     HPI:    19 year old female with past medical history of major depressive disorder, suicide attempt in 2017 via drug overdose who presents to [facility] emergency department brought in by EMS for progressively worsening confusion.  Patient is unable to provide a history due to agitation and confusion.  Majority the history is been obtained from review of emergency department notes as well as discussion with the patient's mother was at the bedside.  2 days ago, patient presented to [facility] emergency department after taking reportedly 6 trazodone and 2 Seroquel on 11/29.  Patient reported that time dizziness shortness of breath nausea and palpitations.  That time, patient denied any suicidal ideation and stated that it was an attempt to get some sleep.  After a work-up and brief period of observation the patient was discharged home.    The following day, the mother reports that she was unable to contact her daughter at her apartment.  Because she was concerned, she called emergency services for a wellness check.  When first responders arrived the patient's home, they had to force entry because no one answered the door.  Patient was brought to the emergency department.  On arrival to the emergency department however, patient at the time denied taking any additional medications today.  Throughout the patient's stay in the emergency department patient is become progressively more confused with periods of lethargy and agitation.  Patient is thought to be suffering from sequela of trazodone and Seroquel overdose.  Case discussed between emergency department provider and poison control recommends continued monitoring with serial chemistries and as needed benzodiazepines for bouts of  agitation.  Hospitalist group has been called to assess the patient for admission the hospital.  Review of Systems:   Review of Systems  Unable to perform ROS: Mental status change    Past Medical History:  Diagnosis Date  . Anxiety   . Depression     History reviewed. No pertinent surgical history.   reports that she has never smoked. She has never used smokeless tobacco. She reports previous drug use. Drug: Marijuana. She reports that she does not drink alcohol.  Allergies  Allergen Reactions  . Lactase Other (See Comments)    Upset stomach     History reviewed. No pertinent family history.   Prior to Admission medications   Medication Sig Start Date End Date Taking? Authorizing Provider  ASHLYNA 0.15-0.03 &0.01 MG tablet Take 1 tablet by mouth daily. 04/19/20  Yes [provider]  QUEtiapine Fumarate (SEROQUEL PO) Take 2 tablets by mouth every other day.   Yes [provider]  traZODone (DESYREL) 50 MG tablet Take 1 tablet (50 mg total) by mouth at bedtime and may repeat dose one time if needed. Patient taking differently: Take 50 mg by mouth as needed for sleep.  03/23/18  Yes Money, Gerlene Burdock, FNP  citalopram (CELEXA) 10 MG tablet Take 1 tablet (10 mg total) by mouth daily. Mood control Patient not taking: Reported on 05/20/2020 03/24/18   Money, Gerlene Burdock, FNP  hydrOXYzine (ATARAX/VISTARIL) 25 MG tablet Take 1 tablet (25 mg total) by mouth every 6 (six) hours as needed for anxiety. Patient not taking: Reported on 05/20/2020 03/23/18   Money, Gerlene Burdock, FNP  potassium chloride (KLOR-CON) 10  MEQ tablet Take 1 tablet (10 mEq total) by mouth daily for 7 days. Patient not taking: Reported on 05/21/2020 05/20/20 05/27/20  Gershon Mussel, MD    Physical Exam: Vitals:   05/21/20 2330 05/22/20 0000 05/22/20 0030 05/22/20 0230  BP: 127/83 135/80 128/83 135/86  Pulse: (!) 123 (!) 105 99 (!) 114  Resp: 18 13 15 19   Temp:      TempSrc:      SpO2: 100% 100% 100%  100%  Weight:      Height:        Constitutional: Patient is currently lethargic and minimally arousable.  Patient is currently not in acute distress. Skin: no rashes, no lesions, good skin turgor noted. Eyes: Pupils are equally reactive to light.  No evidence of scleral icterus or conjunctival pallor.  ENMT: Dry mucous membranes noted.  Posterior pharynx clear of any exudate or lesions.   Neck: normal, supple, no masses, no thyromegaly.  No evidence of jugular venous distension.   Respiratory: clear to auscultation bilaterally, no wheezing, no crackles. Normal respiratory effort. No accessory muscle use.  Cardiovascular: Tachycardic rate and regular rhythm, no murmurs / rubs / gallops. No extremity edema. 2+ pedal pulses. No carotid bruits.  Chest:   Nontender without crepitus or deformity.   Back:   Nontender without crepitus or deformity. Abdomen: Abdomen is soft and nontender.  No evidence of intra-abdominal masses.  Positive bowel sounds noted in all quadrants.   Musculoskeletal: No joint deformity upper and lower extremities. Good ROM, no contractures. Normal muscle tone.  Neurologic: No evidence of muscle rigidity.  No evidence of hyperreflexia.  Patient is currently extremely lethargic and minimally arousable.  Patient is moving all 4 extremities spontaneously.  Patient localizes to pain.  Patient is not following commands. Psychiatric: Unable to assess due to significant lethargy.   Labs on Admission: I have personally reviewed following labs and imaging studies -   CBC: Recent Labs  Lab 05/20/20 1719 05/21/20 1728  WBC 6.0 7.1  HGB 11.4* 12.1  HCT 36.5 38.8  MCV 85.3 84.5  PLT 228 218   Basic Metabolic Panel: Recent Labs  Lab 05/20/20 1719 05/21/20 1728  NA 138 138  K 2.8* 3.6  CL 106 104  CO2 19* 19*  GLUCOSE 107* 88  BUN 12 12  CREATININE 0.65 0.84  CALCIUM 9.0 9.3  MG 2.2 2.3   GFR: Estimated Creatinine Clearance: 86.5 mL/min (by C-G formula based on SCr  of 0.84 mg/dL). Liver Function Tests: Recent Labs  Lab 05/20/20 1719 05/21/20 1728  AST 21 21  ALT 16 17  ALKPHOS 27* 24*  BILITOT 1.0 1.2  PROT 7.2 7.4  ALBUMIN 4.0 4.3   No results for input(s): LIPASE, AMYLASE in the last 168 hours. No results for input(s): AMMONIA in the last 168 hours. Coagulation Profile: No results for input(s): INR, PROTIME in the last 168 hours. Cardiac Enzymes: No results for input(s): CKTOTAL, CKMB, CKMBINDEX, TROPONINI in the last 168 hours. BNP (last 3 results) No results for input(s): PROBNP in the last 8760 hours. HbA1C: No results for input(s): HGBA1C in the last 72 hours. CBG: No results for input(s): GLUCAP in the last 168 hours. Lipid Profile: No results for input(s): CHOL, HDL, LDLCALC, TRIG, CHOLHDL, LDLDIRECT in the last 72 hours. Thyroid Function Tests: Recent Labs    05/21/20 2020  TSH 0.803  FREET4 0.84   Anemia Panel: No results for input(s): VITAMINB12, FOLATE, FERRITIN, TIBC, IRON, RETICCTPCT in the last 72 hours.  Urine analysis: No results found for: COLORURINE, APPEARANCEUR, LABSPEC, PHURINE, GLUCOSEU, HGBUR, BILIRUBINUR, KETONESUR, PROTEINUR, UROBILINOGEN, NITRITE, LEUKOCYTESUR  Radiological Exams on Admission - Personally Reviewed: DG Chest 2 View  Result Date: 05/21/2020 CLINICAL DATA:  Altered mental status, tachycardia, patient consumed unknown dose of Seroquel and trazodone EXAM: CHEST - 2 VIEW COMPARISON:  Radiograph 05/20/2020 FINDINGS: Some streaky opacities are present in the lung bases favoring atelectatic change though given altered mental status aspiration is not fully excluded. No focal consolidation, convincing edema, pneumothorax or effusion. The cardiomediastinal contours are unremarkable. No acute osseous or soft tissue abnormality. Telemetry leads overlie the chest. IMPRESSION: Streaky opacities in the lung bases favoring atelectatic change though given altered mental status aspiration is not fully excluded.  Electronically Signed   By: Kreg ShropshirePrice  DeHay M.D.   On: 05/21/2020 21:48   CT Head Wo Contrast  Result Date: 05/21/2020 CLINICAL DATA:  Recent medication ingestion with delirium, initial encounter EXAM: CT HEAD WITHOUT CONTRAST TECHNIQUE: Contiguous axial images were obtained from the base of the skull through the vertex without intravenous contrast. COMPARISON:  None. FINDINGS: Brain: No evidence of acute infarction, hemorrhage, hydrocephalus, extra-axial collection or mass lesion/mass effect. Vascular: No hyperdense vessel or unexpected calcification. Skull: Normal. Negative for fracture or focal lesion. Sinuses/Orbits: No acute finding. Other: None. IMPRESSION: No acute intracranial abnormality noted. Electronically Signed   By: Alcide CleverMark  Lukens M.D.   On: 05/21/2020 21:52   DG Chest Port 1 View  Result Date: 05/20/2020 CLINICAL DATA:  Shortness of breath, drug overdose EXAM: PORTABLE CHEST 1 VIEW COMPARISON:  None. FINDINGS: No consolidation, features of edema, pneumothorax, or effusion. Pulmonary vascularity is normally distributed. The cardiomediastinal contours are unremarkable. No acute osseous or soft tissue abnormality. Telemetry leads overlie the chest. IMPRESSION: No acute cardiopulmonary abnormality. Electronically Signed   By: Kreg ShropshirePrice  DeHay M.D.   On: 05/20/2020 18:27    EKG: Personally reviewed.  Rhythm is sinus tachycardia with heart rate of 119 bpm.  Slated T wave inversions in lead III.    Assessment/Plan Active Problems:   Accidental drug overdose   Based on history patient provided during previous emergency department presentation as well as discussions with the patient's mother, patient's overdose with both Seroquel and trazodone were unintentional and warrant effort for the patient to get some sleep  That being said, patient does have a history of suicide attempt in the past in 2019 using medications at that time.  Regardless as to the intent this is most likely a drug overdose,  although I believe this is probably due to taking more than the reported for trazodone and to Seroquel that the patient stated during her ED visit on 11/29.    I do not believe the patient is suffering from serotonin syndrome.  No evidence of fevers, no evidence of muscle rigidity, no evidence of hyperreflexia.  Per emergency department provider with poison control, will perform serial chemistries to monitor for organ dysfunction and electrolyte abnormalities.  Will provide patient with as needed benzodiazepines for bouts of agitation.  Furthermore, will monitor patient on telemetry in the progressive unit.  Hydrating patient aggressively with intravenous isotonic fluids  Patient will need psychiatry consultation once mentation improves.  Creatine kinase, lactic acid, urinalysis, urine toxicology screen pending  MDD (major depressive disorder), severe (HCC)    Regimen of trazodone and Seroquel ordered for treatment of major depressive disorder, holding these medications at this time.  Code Status:  Full code Family Communication: Discussed with mother at the bedside at  length who has been updated on plan of care.  Status is: Observation  The patient remains OBS appropriate and will d/c before 2 midnights.  Dispo: The patient is from: Home              Anticipated d/c is to: Home              Anticipated d/c date is: 2 days              Patient currently is not medically stable to d/c.        Marinda Elk MD [facility] Pager [phone]  If 7PM-7AM, please contact night-coverage [email or web address] Use universal  password for that web site. If you do not have the password, please call the hospital operator.  05/22/2020, 2:52 AM

## 2020-05-22 NOTE — ED Notes (Addendum)
Bed alarm placed under pt. Yellow socks placed on pt.

## 2020-05-22 NOTE — Progress Notes (Signed)
Patient is a 19 year old female with past medical history of depressive disorder, suicidal attempt and drug overdose presented to ED with yet another episode of drug overdose, not clear whether this was intentional or accidental.  Patient seen and examined in the ED.  Patient was too lethargic to have any meaningful conversation.  Her speech was also slurred due to lethargy.  While I was in the room, patient was having episodes of intermittent SVTs but they were terminated on its own while patient was not having any symptoms.  As recommended by poison control, monitor electrolytes closely and replace and treat agitation with benzodiazepines.  I have consulted psychiatry to see her as well.

## 2020-05-22 NOTE — ED Notes (Signed)
Mother at bedside. Updated about plan of care.

## 2020-05-22 NOTE — Consult Note (Signed)
[facility] Face-to-Face Psychiatry Consult   Reason for Consult:  Overdose Referring Physician:  Dr. Leafy Half Patient Identification: Leslie Stanley MRN:  [id number] Principal Diagnosis: <principal problem not specified> Diagnosis:  Active Problems:   MDD (major depressive disorder), severe (HCC)   Accidental drug overdose   Total Time spent with patient: 30 minutes  Subjective:   Leslie Stanley is a 19 y.o. female patient admitted with altered mental status after overdose.  HPI:  19 year old female with past medical history of major depressive disorder, suicide attempt in 2017 via drug overdose who presents to [facility] emergency department brought in by EMS for progressively worsening confusion.  Patient is unable to provide a history due to agitation and confusion.  Majority the history is been obtained from review of emergency department notes as well as discussion with the patient's mother was at the bedside.  2 days ago, patient presented to [facility] emergency department after taking reportedly 6 trazodone and 2 Seroquel on 11/29.  Patient reported that time dizziness shortness of breath nausea and palpitations.  That time, patient denied any suicidal ideation and stated that it was an attempt to get some sleep.  After a work-up and brief period of observation the patient was discharged home.    The following day, the mother reports that she was unable to contact her daughter at her apartment.  Because she was concerned, she called emergency services for a wellness check.  When first responders arrived the patient's home, they had to force entry because no one answered the door.  Patient was brought to the emergency department.  On arrival to the emergency department however, patient at the time denied taking any additional medications today.  Throughout the patient's stay in the emergency department patient is become progressively more confused with periods of lethargy and  agitation.  Patient is thought to be suffering from sequela of trazodone and Seroquel overdose.  Case discussed between emergency department Leslie Stanley and poison control recommends continued monitoring with serial chemistries and as needed benzodiazepines for bouts of agitation.  Hospitalist group has been called to assess the patient for admission the hospital.  During the evaluation patient is alert and oriented to self and place only. She is observed to have periods of lucidness and confusion throughout the evaluation. She is very lethargic, although she is easily awoken and will finish her sentence or statement with frequent orientation. While awake she did confirm this was not a suicide attempt. Patient noted to have nonsensical speech at times, and would get visibly upset with this Clinical research associate for repeating the questions. She would begin to speak and then her speech would become garbled, even though she thought she was talking in an appropriate manner. She admits to marijuana use as well, no new products or iliicit substances. Mom denies her getting her substances from a new supplier. She denies any concerns regarding her safety. Patient denies suicidal ideation, homicidal ideations or hallucinations. She did not appear to be responding to internal stimuli. She could not answer all questions appropriately, despite multiple attempts to do so.   Past Psychiatric History:Depression. Previous inpatient psych admission 02/2018 by overdose of 5200mg  of Ibuprofen. And suicide attempt in 2017 by suffocation, denies history of self cutting, denies history of psychosis, does not Denies history of panic or agoraphobia, denies social anxiety, denies history of violence . Historical chart review shows medication of Citalopram and Trazodone. No Seroquel listed, however she has a prescription for Seroquel as she took this medication  in an attempt to get sleep. Patient  Does admit to smoking marijuana, and has not smoked in 3  days as she has been in the hospital.   Risk to Self:  Denies Risk to Others:   Denies Prior Inpatient Therapy:   1 previous admission at [facility] in 02/2018 Prior Outpatient Therapy:   None  Past Medical History:  Past Medical History:  Diagnosis Date  . Anxiety   . Depression    History reviewed. No pertinent surgical history. Family History: History reviewed. No pertinent family history. Family Psychiatric  History: Per mom she denies maternal and paternal hisory of mental illness. Mom also denies family history of suicide.  Social History:  Social History   Substance and Sexual Activity  Alcohol Use Never     Social History   Substance and Sexual Activity  Drug Use Not Currently  . Types: Marijuana    Social History   Socioeconomic History  . Marital status: Single    Spouse name: Not on file  . Number of children: Not on file  . Years of education: Not on file  . Highest education level: Not on file  Occupational History  . Not on file  Tobacco Use  . Smoking status: Never Smoker  . Smokeless tobacco: Never Used  . Tobacco comment: no cessation material needed  Vaping Use  . Vaping Use: Never used  Substance and Sexual Activity  . Alcohol use: Never  . Drug use: Not Currently    Types: Marijuana  . Sexual activity: Yes    Birth control/protection: Pill  Other Topics Concern  . Not on file  Social History Narrative   Main stressors are school, studying, money, working job to pay bills.   Mom helping with expenses also.   Social Determinants of Health   Financial Resource Strain:   . Difficulty of Paying Living Expenses: Not on file  Food Insecurity:   . Worried About Programme researcher, broadcasting/film/video in the Last Year: Not on file  . Ran Out of Food in the Last Year: Not on file  Transportation Needs:   . Lack of Transportation (Medical): Not on file  . Lack of Transportation (Non-Medical): Not on file  Physical Activity:   . Days of Exercise per Week: Not on file   . Minutes of Exercise per Session: Not on file  Stress:   . Feeling of Stress : Not on file  Social Connections:   . Frequency of Communication with Friends and Family: Not on file  . Frequency of Social Gatherings with Friends and Family: Not on file  . Attends Religious Services: Not on file  . Active Member of Clubs or Organizations: Not on file  . Attends Banker Meetings: Not on file  . Marital Status: Not on file   Additional Social History:    Allergies:   Allergies  Allergen Reactions  . Lactase Other (See Comments)    Upset stomach     Labs:  Results for orders placed or performed during the hospital encounter of 05/21/20 (from the past 48 hour(s))  Magnesium     Status: None   Collection Time: 05/21/20  5:28 PM  Result Value Ref Range   Magnesium 2.3 1.7 - 2.4 mg/dL    Comment: Performed at [facility], [address], [place], [place] [address]  CBC     Status: None   Collection Time: 05/21/20  5:28 PM  Result Value Ref Range  WBC 7.1 4.0 - 10.5 K/uL   RBC 4.59 3.87 - 5.11 MIL/uL   Hemoglobin 12.1 12.0 - 15.0 g/dL   HCT 56.2 36 - 46 %   MCV 84.5 80.0 - 100.0 fL   MCH 26.4 26.0 - 34.0 pg   MCHC 31.2 30.0 - 36.0 g/dL   RDW 13.0 86.5 - 78.4 %   Platelets 218 150 - 400 K/uL   nRBC 0.0 0.0 - 0.2 %    Comment: Performed at [facility], [address], [place], [place] [address]  Comprehensive metabolic panel     Status: Abnormal   Collection Time: 05/21/20  5:28 PM  Result Value Ref Range   Sodium 138 135 - 145 mmol/L   Potassium 3.6 3.5 - 5.1 mmol/L    Comment: DELTA CHECK NOTED NO VISIBLE HEMOLYSIS    Chloride 104 98 - 111 mmol/L   CO2 19 (L) 22 - 32 mmol/L   Glucose, Bld 88 70 - 99 mg/dL    Comment: Glucose reference range applies only to samples taken after fasting for at least 8 hours.   BUN 12 6 - 20 mg/dL   Creatinine, Ser 5.28 0.44 - 1.00 mg/dL   Calcium 9.3 8.9 - 41.3 mg/dL   Total Protein 7.4 6.5 - 8.1 g/dL    Albumin 4.3 3.5 - 5.0 g/dL   AST 21 15 - 41 U/L   ALT 17 0 - 44 U/L   Alkaline Phosphatase 24 (L) 38 - 126 U/L   Total Bilirubin 1.2 0.3 - 1.2 mg/dL   GFR, Estimated >24 >40 mL/min    Comment: (NOTE) Calculated using the CKD-EPI Creatinine Equation (2021)    Anion gap 15 5 - 15    Comment: Performed at [facility], [address] [place], [place] [address]  TSH     Status: None   Collection Time: 05/21/20  8:20 PM  Result Value Ref Range   TSH 0.803 0.350 - 4.500 uIU/mL    Comment: Performed by a 3rd Generation assay with a functional sensitivity of <=0.01 uIU/mL. Performed at [facility], [address] [place], [place] [address]   T4, free     Status: None   Collection Time: 05/21/20  8:20 PM  Result Value Ref Range   Free T4 0.84 0.61 - 1.12 ng/dL    Comment: (NOTE) Biotin ingestion may interfere with free T4 tests. If the results are inconsistent with the TSH level, previous test results, or the clinical presentation, then consider biotin interference. If needed, order repeat testing after stopping biotin. Performed at [facility], [address] [place], [place] [address]   Ethanol     Status: None   Collection Time: 05/21/20  8:47 PM  Result Value Ref Range   Alcohol, Ethyl (B) <10 <10 mg/dL    Comment: (NOTE) Lowest detectable limit for serum alcohol is 10 mg/dL.  For medical purposes only. Performed at [facility], [address], [place], [place] [address]   Salicylate level     Status: Abnormal   Collection Time: 05/21/20  8:47 PM  Result Value Ref Range   Salicylate Lvl <7.0 (L) 7.0 - 30.0 mg/dL    Comment: Performed at [facility], [address] [place], [place] [address]  Acetaminophen level     Status: Abnormal   Collection Time: 05/21/20  8:47 PM  Result Value Ref Range   Acetaminophen (Tylenol), Serum <10 (L) 10 -  30 ug/mL    Comment: (NOTE) Therapeutic concentrations vary significantly. A range of 10-30 ug/mL   may be an effective concentration for many patients. However, some  are best treated at concentrations outside of this range. Acetaminophen concentrations >150 ug/mL at 4 hours after ingestion  and >50 ug/mL at 12 hours after ingestion are often associated with  toxic reactions.  Performed at [facility] Lab, [address] [place], [place] [address]   Resp Panel by RT-PCR (Flu A&B, Covid) Nasopharyngeal Swab     Status: None   Collection Time: 05/21/20 11:36 PM   Specimen: Nasopharyngeal Swab; Nasopharyngeal(NP) swabs in vial transport medium  Result Value Ref Range   SARS Coronavirus 2 by RT PCR NEGATIVE NEGATIVE    Comment: (NOTE) SARS-CoV-2 target nucleic acids are NOT DETECTED.  The SARS-CoV-2 RNA is generally detectable in upper respiratory specimens during the acute phase of infection. The lowest concentration of SARS-CoV-2 viral copies this assay can detect is 138 copies/mL. A negative result does not preclude SARS-Cov-2 infection and should not be used as the sole basis for treatment or other patient management decisions. A negative result may occur with  improper specimen collection/handling, submission of specimen other than nasopharyngeal swab, presence of viral mutation(s) within the areas targeted by this assay, and inadequate number of viral copies(<138 copies/mL). A negative result must be combined with clinical observations, patient history, and epidemiological information. The expected result is Negative.  Fact Sheet for Patients:  BloggerCourse.com  Fact Sheet for Healthcare Providers:  SeriousBroker.it  This test is no t yet approved or cleared by the [place] FDA and  has been authorized for detection and/or diagnosis of SARS-CoV-2 by FDA under an Emergency Use Authorization (EUA). This EUA will remain  in effect (meaning this test can be used) for the duration of the COVID-19 declaration under  Section 564(b)(1) of the Act, 21 U.S.C.section 360bbb-3(b)(1), unless the authorization is terminated  or revoked sooner.       Influenza A by PCR NEGATIVE NEGATIVE   Influenza B by PCR NEGATIVE NEGATIVE    Comment: (NOTE) The Xpert Xpress SARS-CoV-2/FLU/RSV plus assay is intended as an aid in the diagnosis of influenza from Nasopharyngeal swab specimens and should not be used as a sole basis for treatment. Nasal washings and aspirates are unacceptable for Xpert Xpress SARS-CoV-2/FLU/RSV testing.  Fact Sheet for Patients: BloggerCourse.com  Fact Sheet for Healthcare Providers: SeriousBroker.it  This test is not yet approved or cleared by the [place] FDA and has been authorized for detection and/or diagnosis of SARS-CoV-2 by FDA under an Emergency Use Authorization (EUA). This EUA will remain in effect (meaning this test can be used) for the duration of the COVID-19 declaration under Section 564(b)(1) of the Act, 21 U.S.C. section 360bbb-3(b)(1), unless the authorization is terminated or revoked.  Performed at [facility], [address], [place], [place] [address]   Lactic acid, plasma     Status: None   Collection Time: 05/22/20  3:05 AM  Result Value Ref Range   Lactic Acid, Venous 0.9 0.5 - 1.9 mmol/L    Comment: Performed at [facility], [address] [place], [place] [address]  Comprehensive metabolic panel     Status: Abnormal   Collection Time: 05/22/20  3:05 AM  Result Value Ref Range   Sodium 139 135 - 145 mmol/L   Potassium 3.4 (L) 3.5 - 5.1 mmol/L   Chloride 107 98 - 111 mmol/L   CO2  20 (L) 22 - 32 mmol/L   Glucose, Bld 81 70 - 99 mg/dL    Comment: Glucose reference range applies only to samples taken after fasting for at least 8 hours.   BUN 9 6 - 20 mg/dL   Creatinine, Ser 0.98 0.44 - 1.00 mg/dL   Calcium 8.7 (L) 8.9 - 10.3 mg/dL   Total Protein 6.4 (L) 6.5 - 8.1 g/dL   Albumin 3.7  3.5 - 5.0 g/dL   AST 17 15 - 41 U/L   ALT 15 0 - 44 U/L   Alkaline Phosphatase 23 (L) 38 - 126 U/L   Total Bilirubin 1.1 0.3 - 1.2 mg/dL   GFR, Estimated >11 >91 mL/min    Comment: (NOTE) Calculated using the CKD-EPI Creatinine Equation (2021)    Anion gap 12 5 - 15    Comment: Performed at [facility], [address] [place], [place] [address]  CBC WITH DIFFERENTIAL     Status: Abnormal   Collection Time: 05/22/20  3:05 AM  Result Value Ref Range   WBC 7.3 4.0 - 10.5 K/uL   RBC 4.03 3.87 - 5.11 MIL/uL   Hemoglobin 11.0 (L) 12.0 - 15.0 g/dL   HCT 56.2 (L) 36 - 46 %   MCV 84.1 80.0 - 100.0 fL   MCH 27.3 26.0 - 34.0 pg   MCHC 32.4 30.0 - 36.0 g/dL   RDW 13.0 86.5 - 78.4 %   Platelets 204 150 - 400 K/uL   nRBC 0.0 0.0 - 0.2 %   Neutrophils Relative % 63 %   Neutro Abs 4.6 1.7 - 7.7 K/uL   Lymphocytes Relative 25 %   Lymphs Abs 1.8 0.7 - 4.0 K/uL   Monocytes Relative 11 %   Monocytes Absolute 0.8 0.1 - 1.0 K/uL   Eosinophils Relative 0 %   Eosinophils Absolute 0.0 0.0 - 0.5 K/uL   Basophils Relative 1 %   Basophils Absolute 0.0 0.0 - 0.1 K/uL   Immature Granulocytes 0 %   Abs Immature Granulocytes 0.02 0.00 - 0.07 K/uL    Comment: Performed at [facility], [address], [place], [place] [address]  Phosphorus     Status: None   Collection Time: 05/22/20  3:05 AM  Result Value Ref Range   Phosphorus 3.1 2.5 - 4.6 mg/dL    Comment: Performed at [facility], [address], [place], [place] [address]  Magnesium     Status: None   Collection Time: 05/22/20  3:05 AM  Result Value Ref Range   Magnesium 2.0 1.7 - 2.4 mg/dL    Comment: Performed at [facility], [address] [place], [place] [address]  CK     Status: None   Collection Time: 05/22/20  3:05 AM  Result Value Ref Range   Total CK 97 38.0 - 234.0 U/L    Comment: Performed at [facility], [address], [place], [place] [address]    Current Facility-Administered Medications   Medication Dose Route Frequency Leslie Stanley Last Rate Last Admin  . acetaminophen (TYLENOL) tablet 650 mg  650 mg Oral Q6H PRN Shalhoub, Deno Lunger, MD       Or  . acetaminophen (TYLENOL) suppository 650 mg  650 mg Rectal Q6H PRN Shalhoub, Deno Lunger, MD      . enoxaparin (LOVENOX) injection 40 mg  40 mg Subcutaneous Q24H Shalhoub, Deno Lunger, MD      . lactated ringers infusion  Intravenous Continuous Marinda Elk, MD 125 mL/hr at 05/22/20 0306 New Bag at 05/22/20 0306  . ondansetron (ZOFRAN) tablet 4 mg  4 mg Oral Q6H PRN Shalhoub, Deno Lunger, MD       Or  . ondansetron [facility]) injection 4 mg  4 mg Intravenous Q6H PRN Shalhoub, Deno Lunger, MD      . potassium chloride SA (KLOR-CON) CR tablet 40 mEq  40 mEq Oral Once Hughie Closs, MD       Current Outpatient Medications  Medication Sig Dispense Refill  . ASHLYNA 0.15-0.03 &0.01 MG tablet Take 1 tablet by mouth daily.    . QUEtiapine Fumarate (SEROQUEL PO) Take 2 tablets by mouth every other day.    . traZODone (DESYREL) 50 MG tablet Take 1 tablet (50 mg total) by mouth at bedtime and may repeat dose one time if needed. (Patient taking differently: Take 50 mg by mouth as needed for sleep. ) 30 tablet 0  . citalopram (CELEXA) 10 MG tablet Take 1 tablet (10 mg total) by mouth daily. Mood control (Patient not taking: Reported on 05/20/2020) 30 tablet 0  . hydrOXYzine (ATARAX/VISTARIL) 25 MG tablet Take 1 tablet (25 mg total) by mouth every 6 (six) hours as needed for anxiety. (Patient not taking: Reported on 05/20/2020) 30 tablet 0  . potassium chloride (KLOR-CON) 10 MEQ tablet Take 1 tablet (10 mEq total) by mouth daily for 7 days. (Patient not taking: Reported on 05/21/2020) 7 tablet 0    Musculoskeletal: Strength & Muscle Tone: abnormal Gait & Station: unsteady Patient leans: Left  Psychiatric Specialty Exam: Physical Exam  Review of Systems  Blood pressure 136/80, pulse (!) 117, temperature 98.7 F (37.1 C), temperature source Oral,  resp. rate 17, height 5' 2.5" (1.588 m), weight 51.7 kg, SpO2 100 %.Body mass index is 20.52 kg/m.  General Appearance: Casual and Green hair  Eye Contact:  Minimal  Speech:  Garbled and Nonsensical  Volume:  Decreased  Mood:  Dysphoric and Irritable  Affect:  Flat and Restricted  Thought Process:  Disorganized, Irrelevant and Descriptions of Associations: Loose  Orientation:  Other:  self and place.   Thought Content:  Illogical  Suicidal Thoughts:  No  Homicidal Thoughts:  No  Memory:  Immediate;   Poor Recent;   Poor Remote;   Poor  Judgement:  Impaired  Insight:  Lacking  Psychomotor Activity:  Increased and Restlessness  Concentration:  Concentration: Fair and Attention Span: Poor  Recall:  Poor  Fund of Knowledge:  Fair  Language:  Poor  Akathisia:  Negative  Handed:  Right  AIMS (if indicated):     Assets:  Communication Skills Desire for Improvement Financial Resources/Insurance Housing Leisure Time Physical Health Resilience Social Support  ADL's:  Impaired  Cognition:  Impaired,  Mild  Sleep:      Leslie Stanley is a 19 year old female with presents with unintentional overdose of Trazodone and Seroquel. There was concerns regarding this being intentional overdose due to her past psychiatric history. However patient and mother confirmed that this was not an attempt to harm herself. Patient denies any known triggers. She admits to marijuana use, UDS not available at this time however states she has not smoked in 3 days as she has been in the hospital.  She denies any current suicidal ideations.   -Patient is very petite, and it is likely she is experiencing serotonin syndrome from accidental overdose on Trazodone and Seroquel. She has history of depression, however this has  been stable with the exception of insomnia. She denies any suicidal thoughts, homicidal thoughts or hallucinations.   Treatment Plan Summary: Plan Recommend follow up with outpatient psychiatry for  managment of depression. Recommend working closely with SW [facility] if  she is unable to be seen. Continue current treatment as planned. Will recommend no additional serotonergic agents at this time.  -Due to her confusion and agitation, she will benefit from a safety sitter when her mother is not present. As she made multiple attempts to get up out the bed while in the room.    Disposition: No evidence of imminent risk to self or others at present.   Patient does not meet criteria for psychiatric inpatient admission. Supportive therapy provided about ongoing stressors. Refer to IOP. Discussed crisis plan, support from social network, calling 911, coming to the Emergency Department, and calling Suicide Hotline.  Leslie Amosakia S Starkes-Perry, FNP 05/22/2020 10:27 AM

## 2020-05-22 NOTE — Progress Notes (Signed)
Patient refusing labs.  Attempted multiple times to discuss with patient and patients mother at bedside

## 2020-05-23 LAB — COMPREHENSIVE METABOLIC PANEL
ALT: 13 U/L (ref 0–44)
AST: 15 U/L (ref 15–41)
Albumin: 3.5 g/dL (ref 3.5–5.0)
Alkaline Phosphatase: 24 U/L — ABNORMAL LOW (ref 38–126)
Anion gap: 13 (ref 5–15)
BUN: 5 mg/dL — ABNORMAL LOW (ref 6–20)
CO2: 24 mmol/L (ref 22–32)
Calcium: 9.6 mg/dL (ref 8.9–10.3)
Chloride: 104 mmol/L (ref 98–111)
Creatinine, Ser: 0.73 mg/dL (ref 0.44–1.00)
GFR, Estimated: >60 mL/min (ref >60)
Glucose, Bld: 84 mg/dL (ref 70–99)
Potassium: 4.1 mmol/L (ref 3.5–5.1)
Sodium: 141 mmol/L (ref 135–145)
Total Bilirubin: 0.9 mg/dL (ref 0.3–1.2)
Total Protein: 6.3 g/dL — ABNORMAL LOW (ref 6.5–8.1)

## 2020-05-23 NOTE — Discharge Instructions (Signed)
Accidental Drug Poisoning, Adult Accidental drug poisoning happens when a person accidentally takes too much of a substance, such as a prescription medicine, an over-the-counter medicine, a vitamin, a supplement, or an illegal drug. The effects of drug poisoning can be mild, dangerous, or even deadly. What are the causes? This condition is caused by taking too much of a medicine, illegal drug, or other substance. It often results from:  Lack of knowledge about a substance.  Using more than one substance at the same time.  An error made by the health care provider who prescribed the substance.  An error made by the pharmacist who filled the prescription.  A lapse in memory, such as forgetting that you have already taken a dose of the medicine.  Suddenly using a substance after a long period of not using it. The following substances and medicines are more likely to cause an accidental drug poisoning:  Medicines that treat mental problems (psychotropic medicines).  Pain medicines.  Cocaine.  Heroin.  Multivitamins that contain iron.  Over-the-counter cold and cough medicines. What increases the risk? This condition is more likely to occur in:  Elderly adults. Elderly adults are at risk because they may: ? Be taking many different medicines. ? Have difficulty reading labels. ? Forget when they last took their medicine.  People who use illegal drugs.  People who drink alcohol while using illegal drugs or certain medicines.  People with certain mental health conditions. What are the signs or symptoms? Symptoms of this condition depend on the substance and the amount that was taken. Common symptoms include:  Behavior changes, such as confusion.  Sleepiness.  Weakness.  Slowed breathing.  Nausea and vomiting.  Seizures.  Very large or small eye pupil size. A drug poisoning can cause a very serious condition in which your blood pressure drops to a low level (shock).  Symptoms of shock include:  Cold and clammy skin.  Pale skin.  Blue lips.  Very slow breathing.  Extreme sleepiness.  Severe confusion.  Dizziness or fainting. How is this diagnosed? This condition is diagnosed based on:  Your symptoms. You will be asked about the substances you took and when you took them.  A physical exam. You may also have other tests, including:  Urine tests.  Blood tests.  An electrocardiogram (ECG). How is this treated? This condition may need to be treated right away at the hospital. Treatment may involve:  Getting fluids and electrolytes through an IV.  Having a breathing tube inserted in your airway (endotracheal tube) to help you breathe.  Taking medicines. These may include medicines that: ? Absorb any substance that is in your digestive system. ? Block or reverse the effect of the substance that caused the drug poisoning.  Having your blood filtered through an artificial kidney machine (hemodialysis).  Ongoing counseling and mental health support. This may be provided if you used an illegal drug. Follow these instructions at home: Medicines   Take over-the-counter and prescription medicines only as told by your health care provider.  Before taking a new medicine, ask your health care provider whether the medicine: ? May cause side effects. ? Might react with other medicines.  Keep a list of all the medicines that you take, including over-the-counter medicines, vitamins, supplements, and herbs. Bring this list with you to all of your medical visits. General instructions   Drink enough fluid to keep your urine pale yellow.  If you are working with a counselor or mental health professional, make   sure to follow his or her instructions.  Do not drink alcohol if: ? Your health care provider tells you not to drink. ? You are pregnant, may be pregnant, or are planning to become pregnant.  If you drink alcohol, limit how much you  have: ? 0-1 drink a day for women. ? 0-2 drinks a day for men.  Be aware of how much alcohol is in your drink. In the [place] one drink equals one typical bottle of beer (12 oz), one-half glass of wine (5 oz), or one shot of hard liquor (1 oz).  Keep all follow-up visits as told by your health care provider. This is important. How is this prevented?   Get help if you are struggling with: ? Alcohol or drug use. ? Depression or another mental health problem.  Keep the phone number of your local poison control center near your phone or on your cell phone. The hotline of the [employer] of Poison Control Centers is (800) 222-1222.  Store all medicines in safety containers that are out of the reach of children.  Read the drug inserts that come with your medicines.  Create a system for taking your medicine, such as a pillbox, that will help you avoid taking too much of the medicine.  Do not drink alcohol while taking medicines unless your health care provider approves.  Do not use illegal drugs.  Do not take medicines that are not prescribed for you. Contact a health care provider if:  Your symptoms return.  You develop new symptoms or side effects after taking a medicine.  You have questions about possible drug poisoning. Call your local poison control center at (800) 222-1222. Get help right away if:  You think that you or someone else may have taken too much of a substance.  You or someone else is having symptoms of drug poisoning. Summary  Accidental drug poisoning happens when a person accidentally takes too much of a substance, such as a prescription medicine, an over-the-counter medicine, a vitamin, a supplement, or an illegal drug.  The effects of drug poisoning can be mild, dangerous, or even deadly.  This condition is diagnosed based on your symptoms and a physical exam. You will be asked to tell your health care provider which substances you took and when you  took them.  This condition may need to be treated right away at the hospital. This information is not intended to replace advice given to you by your health care provider. Make sure you discuss any questions you have with your health care provider. Document Revised: 05/21/2017 Document Reviewed: 05/10/2017 [employer] Patient Education  2020 [employer]  

## 2020-05-23 NOTE — Plan of Care (Signed)

## 2020-05-23 NOTE — Plan of Care (Signed)
  Problem: Education: Goal: Knowledge of General Education information will improve Description: Including pain rating scale, medication(s)/side effects and non-pharmacologic comfort measures Outcome: Adequate for Discharge   Problem: Health Behavior/Discharge Planning: Goal: Ability to manage health-related needs will improve Outcome: Adequate for Discharge   Problem: Clinical Measurements: Goal: Ability to maintain clinical measurements within normal limits will improve Outcome: Adequate for Discharge Goal: Will remain free from infection Outcome: Adequate for Discharge Goal: Diagnostic test results will improve Outcome: Adequate for Discharge Goal: Respiratory complications will improve Outcome: Adequate for Discharge Goal: Cardiovascular complication will be avoided Outcome: Adequate for Discharge   Problem: Activity: Goal: Risk for activity intolerance will decrease Outcome: Adequate for Discharge   Problem: Nutrition: Goal: Adequate nutrition will be maintained Outcome: Adequate for Discharge   Problem: Coping: Goal: Level of anxiety will decrease Outcome: Adequate for Discharge   Problem: Elimination: Goal: Will not experience complications related to bowel motility Outcome: Adequate for Discharge Goal: Will not experience complications related to urinary retention Outcome: Adequate for Discharge   Problem: Pain Managment: Goal: General experience of comfort will improve Outcome: Adequate for Discharge   Problem: Safety: Goal: Ability to remain free from injury will improve Outcome: Adequate for Discharge   Problem: Skin Integrity: Goal: Risk for impaired skin integrity will decrease Outcome: Adequate for Discharge   Problem: Education: Goal: Knowledge of disease or condition will improve Outcome: Adequate for Discharge Goal: Understanding of discharge needs will improve Outcome: Adequate for Discharge   Problem: Health Behavior/Discharge  Planning: Goal: Ability to identify changes in lifestyle to reduce recurrence of condition will improve Outcome: Adequate for Discharge Goal: Identification of resources available to assist in meeting health care needs will improve Outcome: Adequate for Discharge   Problem: Physical Regulation: Goal: Complications related to the disease process, condition or treatment will be avoided or minimized Outcome: Adequate for Discharge   Problem: Safety: Goal: Ability to remain free from injury will improve Outcome: Adequate for Discharge   

## 2020-05-23 NOTE — Progress Notes (Signed)
Pt's IV was removed, lovenox refused, d/c paperwork explained and handed off to pt. Pt had no questions but asked if she can wait in the room for her mother to pick her up. Vitals stable, alert and oriented, mobility back to baseline, clear speech, no pain reported, pt ready to go home.

## 2020-05-23 NOTE — Discharge Summary (Signed)
Physician Discharge Summary  Leslie Stanley JQZ:[id number] DOB: 11-01-2000 DOA: 05/21/2020  PCP: Pcp, No  Admit date: 05/21/2020 Discharge date: 05/23/2020  Admitted From: Home Disposition: Home  Recommendations for Outpatient Follow-up:  1. Follow up with PCP in 1-2 weeks 2. Follow-up with psychiatry outpatient in 1 to 2 weeks 3. Please obtain BMP/CBC in one week 4. Please follow up with your PCP on the following pending results: Unresulted Labs (From admission, onward)          Start     Ordered   05/22/20 1256  Urine rapid drug screen (hosp performed)  Add-on,   AD        05/22/20 1255           Home Health: None Equipment/Devices: None  Discharge Condition: Stable CODE STATUS: Full code Diet recommendation: Regular  Subjective: Seen and examined.  Completely alert and oriented.  Speech fluent.  Denies any complaint.  She is very clear that it was accidental overdose.  She is looking very happy and pleasant.  Her mother is also at the bedside.  Mother also agrees that patient does not have any active depression at this point in time and that this was purely an accidental overdose.  HPI:    19 year old female with past medical history of major depressive disorder, suicide attempt in 2017 via drug overdose who presents to [facility] emergency department brought in by EMS for progressively worsening confusion.  Patient is unable to provide a history due to agitation and confusion.  Majority the history is been obtained from review of emergency department notes as well as discussion with the patient's mother was at the bedside.  2 days ago, patient presented to [facility] emergency department after taking reportedly 6 trazodone and 2 Seroquel on 11/29.  Patient reported that time dizziness shortness of breath nausea and palpitations.  That time, patient denied any suicidal ideation and stated that it was an attempt to get some sleep.  After a work-up and brief  period of observation the patient was discharged home.    The following day, the mother reports that she was unable to contact her daughter at her apartment.  Because she was concerned, she called emergency services for a wellness check.  When first responders arrived the patient's home, they had to force entry because no one answered the door.  Patient was brought to the emergency department.  On arrival to the emergency department however, patient at the time denied taking any additional medications today.  Throughout the patient's stay in the emergency department patient is become progressively more confused with periods of lethargy and agitation.  Patient is thought to be suffering from sequela of trazodone and Seroquel overdose.  Case discussed between emergency department provider and poison control recommends continued monitoring with serial chemistries and as needed benzodiazepines for bouts of agitation.  Hospitalist group has been called to assess the patient for admission the hospital.  Brief/Interim Summary: Patient was admitted under hospital service due to drug overdose.  She was admitted for observation.  Electrolytes were corrected.  Psychiatry was consulted.  They also cleared her for discharge home as they also believe that this was an accidental overdose and not intentional.  Patient is much more alert and oriented today.  She is looking very happy.  Her speech is fluent as opposed to garbled as it was yesterday.  She also verbalized that it was accidental.  Her mother also agrees with that.  All electrolytes normal.  Hemodynamically stable.  I do not think she has any serotonin syndrome at this point in time.  We will resume her home medications which is Seroquel and trazodone.  Discharge Diagnoses:  Active Problems:   MDD (major depressive disorder), severe (HCC)   Accidental drug overdose    Discharge Instructions   Allergies as of 05/23/2020      Reactions   Lactase Other  (See Comments)   Upset stomach       Medication List    STOP taking these medications   citalopram 10 MG tablet Commonly known as: CELEXA   hydrOXYzine 25 MG tablet Commonly known as: ATARAX/VISTARIL   potassium chloride 10 MEQ tablet Commonly known as: KLOR-CON     TAKE these medications   Ashlyna 0.15-0.03 &0.01 MG tablet Generic drug: Levonorgestrel-Ethinyl Estradiol Take 1 tablet by mouth daily.   SEROQUEL PO Take 2 tablets by mouth every other day.   traZODone 50 MG tablet Commonly known as: DESYREL Take 1 tablet (50 mg total) by mouth at bedtime and may repeat dose one time if needed. What changed:   when to take this  reasons to take this       Allergies  Allergen Reactions  . Lactase Other (See Comments)    Upset stomach     Consultations: Psychiatry   Procedures/Studies: DG Chest 2 View  Result Date: 05/21/2020 CLINICAL DATA:  Altered mental status, tachycardia, patient consumed unknown dose of Seroquel and trazodone EXAM: CHEST - 2 VIEW COMPARISON:  Radiograph 05/20/2020 FINDINGS: Some streaky opacities are present in the lung bases favoring atelectatic change though given altered mental status aspiration is not fully excluded. No focal consolidation, convincing edema, pneumothorax or effusion. The cardiomediastinal contours are unremarkable. No acute osseous or soft tissue abnormality. Telemetry leads overlie the chest. IMPRESSION: Streaky opacities in the lung bases favoring atelectatic change though given altered mental status aspiration is not fully excluded. Electronically Signed   By: Kreg ShropshirePrice  DeHay M.D.   On: 05/21/2020 21:48   CT Head Wo Contrast  Result Date: 05/21/2020 CLINICAL DATA:  Recent medication ingestion with delirium, initial encounter EXAM: CT HEAD WITHOUT CONTRAST TECHNIQUE: Contiguous axial images were obtained from the base of the skull through the vertex without intravenous contrast. COMPARISON:  None. FINDINGS: Brain: No evidence  of acute infarction, hemorrhage, hydrocephalus, extra-axial collection or mass lesion/mass effect. Vascular: No hyperdense vessel or unexpected calcification. Skull: Normal. Negative for fracture or focal lesion. Sinuses/Orbits: No acute finding. Other: None. IMPRESSION: No acute intracranial abnormality noted. Electronically Signed   By: Alcide CleverMark  Lukens M.D.   On: 05/21/2020 21:52   DG Chest Port 1 View  Result Date: 05/20/2020 CLINICAL DATA:  Shortness of breath, drug overdose EXAM: PORTABLE CHEST 1 VIEW COMPARISON:  None. FINDINGS: No consolidation, features of edema, pneumothorax, or effusion. Pulmonary vascularity is normally distributed. The cardiomediastinal contours are unremarkable. No acute osseous or soft tissue abnormality. Telemetry leads overlie the chest. IMPRESSION: No acute cardiopulmonary abnormality. Electronically Signed   By: Kreg ShropshirePrice  DeHay M.D.   On: 05/20/2020 18:27      Discharge Exam: Vitals:   05/23/20 0326 05/23/20 0733  BP: 129/79 119/86  Pulse: 77   Resp: 16   Temp: 98.2 F (36.8 C) 98.5 F (36.9 C)  SpO2: 98% 98%   Vitals:   05/22/20 1918 05/22/20 2354 05/23/20 0326 05/23/20 0733  BP: 122/80 120/75 129/79 119/86  Pulse: 97 79 77   Resp: 20 15 16    Temp: 98 F (36.7 C)  98.2 F (36.8 C) 98.2 F (36.8 C) 98.5 F (36.9 C)  TempSrc: Oral Oral Oral Oral  SpO2: 100% 99% 98% 98%  Weight:      Height:        General: Pt is alert, awake, not in acute distress Cardiovascular: RRR, S1/S2 +, no rubs, no gallops Respiratory: CTA bilaterally, no wheezing, no rhonchi Abdominal: Soft, NT, ND, bowel sounds + Extremities: no edema, no cyanosis    The results of significant diagnostics from this hospitalization (including imaging, microbiology, ancillary and laboratory) are listed below for reference.     Microbiology: Recent Results (from the past 240 hour(s))  Resp Panel by RT-PCR (Flu A&B, Covid) Nasopharyngeal Swab     Status: None   Collection Time:  05/21/20 11:36 PM   Specimen: Nasopharyngeal Swab; Nasopharyngeal(NP) swabs in vial transport medium  Result Value Ref Range Status   SARS Coronavirus 2 by RT PCR NEGATIVE NEGATIVE Final    Comment: (NOTE) SARS-CoV-2 target nucleic acids are NOT DETECTED.  The SARS-CoV-2 RNA is generally detectable in upper respiratory specimens during the acute phase of infection. The lowest concentration of SARS-CoV-2 viral copies this assay can detect is 138 copies/mL. A negative result does not preclude SARS-Cov-2 infection and should not be used as the sole basis for treatment or other patient management decisions. A negative result may occur with  improper specimen collection/handling, submission of specimen other than nasopharyngeal swab, presence of viral mutation(s) within the areas targeted by this assay, and inadequate number of viral copies(<138 copies/mL). A negative result must be combined with clinical observations, patient history, and epidemiological information. The expected result is Negative.  Fact Sheet for Patients:  BloggerCourse.com  Fact Sheet for Healthcare Providers:  SeriousBroker.it  This test is no t yet approved or cleared by the [place] FDA and  has been authorized for detection and/or diagnosis of SARS-CoV-2 by FDA under an Emergency Use Authorization (EUA). This EUA will remain  in effect (meaning this test can be used) for the duration of the COVID-19 declaration under Section 564(b)(1) of the Act, 21 U.S.C.section 360bbb-3(b)(1), unless the authorization is terminated  or revoked sooner.       Influenza A by PCR NEGATIVE NEGATIVE Final   Influenza B by PCR NEGATIVE NEGATIVE Final    Comment: (NOTE) The Xpert Xpress SARS-CoV-2/FLU/RSV plus assay is intended as an aid in the diagnosis of influenza from Nasopharyngeal swab specimens and should not be used as a sole basis for treatment. Nasal washings  and aspirates are unacceptable for Xpert Xpress SARS-CoV-2/FLU/RSV testing.  Fact Sheet for Patients: BloggerCourse.com  Fact Sheet for Healthcare Providers: SeriousBroker.it  This test is not yet approved or cleared by the [place] FDA and has been authorized for detection and/or diagnosis of SARS-CoV-2 by FDA under an Emergency Use Authorization (EUA). This EUA will remain in effect (meaning this test can be used) for the duration of the COVID-19 declaration under Section 564(b)(1) of the Act, 21 U.S.C. section 360bbb-3(b)(1), unless the authorization is terminated or revoked.  Performed at [facility], [address] [place], [place] [address]      Labs: BNP (last 3 results) No results for input(s): BNP in the last 8760 hours. Basic Metabolic Panel: Recent Labs  Lab 05/20/20 1719 05/21/20 1728 05/22/20 0305 05/23/20 0335  NA 138 138 139 141  K 2.8* 3.6 3.4* 4.1  CL 106 104 107 104  CO2 19* 19* 20* 24  GLUCOSE 107* 88 81 84  BUN 5*  CREATININE 0.65 0.84 0.73 0.73  CALCIUM 9.0 9.3 8.7* 9.6  MG 2.2 2.3 2.0  --   PHOS  --   --  3.1  --    Liver Function Tests: Recent Labs  Lab 05/20/20 1719 05/21/20 1728 05/22/20 0305 05/23/20 0335  AST ALT ALKPHOS 27* 24* 23* 24*  BILITOT 1.0 1.2 1.1 0.9  PROT 7.2 7.4 6.4* 6.3*  ALBUMIN 4.0 4.3 3.7 3.5   No results for input(s): LIPASE, AMYLASE in the last 168 hours. No results for input(s): AMMONIA in the last 168 hours. CBC: Recent Labs  Lab 05/20/20 1719 05/21/20 1728 05/22/20 0305  WBC 6.0 7.1 7.3  NEUTROABS  --   --  4.6  HGB 11.4* 12.1 11.0*  HCT 36.5 38.8 33.9*  MCV 85.3 84.5 84.1  PLT 228 218 204   Cardiac Enzymes: Recent Labs  Lab 05/22/20 0305  CKTOTAL 97   BNP: Invalid input(s): POCBNP CBG: No results for input(s): GLUCAP in the last 168 hours. D-Dimer No results for input(s): DDIMER in the last  72 hours. Hgb A1c No results for input(s): HGBA1C in the last 72 hours. Lipid Profile No results for input(s): CHOL, HDL, LDLCALC, TRIG, CHOLHDL, LDLDIRECT in the last 72 hours. Thyroid function studies Recent Labs    05/21/20 2020  TSH 0.803   Anemia work up No results for input(s): VITAMINB12, FOLATE, FERRITIN, TIBC, IRON, RETICCTPCT in the last 72 hours. Urinalysis    Component Value Date/Time   COLORURINE YELLOW 05/22/2020 1157   APPEARANCEUR CLEAR 05/22/2020 1157   LABSPEC 1.012 05/22/2020 1157   PHURINE 7.0 05/22/2020 1157   GLUCOSEU NEGATIVE 05/22/2020 1157   HGBUR MODERATE (A) 05/22/2020 1157   BILIRUBINUR NEGATIVE 05/22/2020 1157   KETONESUR 20 (A) 05/22/2020 1157   PROTEINUR NEGATIVE 05/22/2020 1157   NITRITE NEGATIVE 05/22/2020 1157   LEUKOCYTESUR NEGATIVE 05/22/2020 1157   Sepsis Labs Invalid input(s): PROCALCITONIN,  WBC,  LACTICIDVEN Microbiology Recent Results (from the past 240 hour(s))  Resp Panel by RT-PCR (Flu A&B, Covid) Nasopharyngeal Swab     Status: None   Collection Time: 05/21/20 11:36 PM   Specimen: Nasopharyngeal Swab; Nasopharyngeal(NP) swabs in vial transport medium  Result Value Ref Range Status   SARS Coronavirus 2 by RT PCR NEGATIVE NEGATIVE Final    Comment: (NOTE) SARS-CoV-2 target nucleic acids are NOT DETECTED.  The SARS-CoV-2 RNA is generally detectable in upper respiratory specimens during the acute phase of infection. The lowest concentration of SARS-CoV-2 viral copies this assay can detect is 138 copies/mL. A negative result does not preclude SARS-Cov-2 infection and should not be used as the sole basis for treatment or other patient management decisions. A negative result may occur with  improper specimen collection/handling, submission of specimen other than nasopharyngeal swab, presence of viral mutation(s) within the areas targeted by this assay, and inadequate number of viral copies(<138 copies/mL). A negative result must  be combined with clinical observations, patient history, and epidemiological information. The expected result is Negative.  Fact Sheet for Patients:  BloggerCourse.com  Fact Sheet for Healthcare Providers:  SeriousBroker.it  This test is no t yet approved or cleared by the [place] FDA and  has been authorized for detection and/or diagnosis of SARS-CoV-2 by FDA under an Emergency Use Authorization (EUA). This EUA will remain  in effect (meaning this test can be used) for the duration of the COVID-19 declaration under Section  564(b)(1) of the Act, 21 U.S.C.section 360bbb-3(b)(1), unless the authorization is terminated  or revoked sooner.       Influenza A by PCR NEGATIVE NEGATIVE Final   Influenza B by PCR NEGATIVE NEGATIVE Final    Comment: (NOTE) The Xpert Xpress SARS-CoV-2/FLU/RSV plus assay is intended as an aid in the diagnosis of influenza from Nasopharyngeal swab specimens and should not be used as a sole basis for treatment. Nasal washings and aspirates are unacceptable for Xpert Xpress SARS-CoV-2/FLU/RSV testing.  Fact Sheet for Patients: BloggerCourse.com  Fact Sheet for Healthcare Providers: SeriousBroker.it  This test is not yet approved or cleared by the [place] FDA and has been authorized for detection and/or diagnosis of SARS-CoV-2 by FDA under an Emergency Use Authorization (EUA). This EUA will remain in effect (meaning this test can be used) for the duration of the COVID-19 declaration under Section 564(b)(1) of the Act, 21 U.S.C. section 360bbb-3(b)(1), unless the authorization is terminated or revoked.  Performed at [facility], [address] [place], [place] [address]      Time coordinating discharge: Over 30 minutes  SIGNED:   Hughie Closs, MD  [facility] 05/23/2020, 10:08 AM  If 7PM-7AM, please contact  night-coverage [email or web address]

## 2021-07-20 ENCOUNTER — Emergency Department (HOSPITAL_COMMUNITY): Payer: No Typology Code available for payment source

## 2021-07-20 ENCOUNTER — Encounter (HOSPITAL_COMMUNITY): Payer: Self-pay | Admitting: Emergency Medicine

## 2021-07-20 ENCOUNTER — Emergency Department (HOSPITAL_COMMUNITY)
Admission: EM | Admit: 2021-07-20 | Discharge: 2021-07-20 | Disposition: A | Discharge status: Home / Self Care | Payer: No Typology Code available for payment source | Attending: Emergency Medicine | Admitting: Emergency Medicine

## 2021-07-20 DIAGNOSIS — Y92410 Unspecified street and highway as the place of occurrence of the external cause: Secondary | ICD-10-CM | POA: Diagnosis not present

## 2021-07-20 DIAGNOSIS — S199XXA Unspecified injury of neck, initial encounter: Secondary | ICD-10-CM | POA: Insufficient documentation

## 2021-07-20 DIAGNOSIS — S299XXA Unspecified injury of thorax, initial encounter: Secondary | ICD-10-CM | POA: Insufficient documentation

## 2021-07-20 MED ORDER — ACETAMINOPHEN 325 MG PO TABS
650.0000 mg | ORAL_TABLET | Freq: Once | ORAL | Status: AC
  Administered 2021-07-20: 650 mg via ORAL
  Filled 2021-07-20: qty 2
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
## 2021-07-20 NOTE — Discharge Instructions (Signed)
The CT scans were limited given that they had no IV contrast.  No serous injury noted in the scans however.  Return back to the ER if you have worsening pain or new symptoms otherwise follow-up with your doctor in 1 or 2 days.
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
## 2022-01-20 ENCOUNTER — Encounter (HOSPITAL_COMMUNITY): Payer: Self-pay

## 2022-01-20 ENCOUNTER — Ambulatory Visit (HOSPITAL_COMMUNITY): Payer: Self-pay

## 2022-01-20 ENCOUNTER — Ambulatory Visit (HOSPITAL_COMMUNITY)
Admission: RE | Admit: 2022-01-20 | Discharge: 2022-01-20 | Disposition: A | Discharge status: Home / Self Care | Payer: Self-pay | Source: Ambulatory Visit | Attending: Family Medicine | Admitting: Family Medicine

## 2022-01-20 VITALS — BP 110/73 | HR 76 | Temp 99.1°F | Resp 16

## 2022-01-20 DIAGNOSIS — L03315 Cellulitis of perineum: Secondary | ICD-10-CM

## 2022-01-20 LAB — POC URINE PREG, ED: Preg Test, Ur: NEGATIVE

## 2022-01-20 MED ORDER — AMOXICILLIN-POT CLAVULANATE 875-125 MG PO TABS: 1.0000 | ORAL_TABLET | Freq: Two times a day (BID) | ORAL | 0 refills | Status: AC

## 2022-01-20 NOTE — ED Triage Notes (Signed)
Pt states she has a lump on her vagina since yesterday.

## 2022-01-20 NOTE — ED Provider Notes (Signed)
[facility]    CSN: [id number] Arrival date & time: 01/20/22  1505      History   Chief Complaint Chief Complaint  Patient presents with   Vaginal Itching    I have a small bump on my vagina. It's small and pink near my hole. It does not itch nor is it inflamed. Also my innards are swollen and everything is uncomfortable. not experiencing any burning, itching, or any painful sensation. - Entered by patient    HPI Leslie Stanley is a 21 y.o. female.    Vaginal Itching   Here for a bump that is maybe a little uncomfortable on her right side of her vaginal introitus.  She has noticed it here in the last 2 or 3 days.  No dysuria and no vaginal irritation.  On her triage note she stated that her "innards" had been swollen,, but she gives me no history of abdominal pain, fever or vomiting.  No dysuria.  Last menstrual cycle was July 6.  Past Medical History:  Diagnosis Date   Anxiety    Depression     Patient Active Problem List   Diagnosis Date Noted   Accidental drug overdose 05/21/2020   MDD (major depressive disorder), severe (HCC) 03/20/2018    History reviewed. No pertinent surgical history.  OB History   No obstetric history on file.      Home Medications    Prior to Admission medications   Medication Sig Start Date End Date Taking? Authorizing Provider  amoxicillin-clavulanate (AUGMENTIN) 875-125 MG tablet Take 1 tablet by mouth 2 (two) times daily for 7 days. 01/20/22 01/27/22 Yes Zenia Resides, MD  traZODone (DESYREL) 50 MG tablet Take 1 tablet (50 mg total) by mouth at bedtime and may repeat dose one time if needed. Patient taking differently: Take 50 mg by mouth as needed for sleep. 03/23/18   Money, Gerlene Burdock, FNP    Family History History reviewed. No pertinent family history.  Social History Social History   Tobacco Use   Smoking status: Never   Smokeless tobacco: Never   Tobacco comments:    no cessation material needed  Vaping Use    Vaping Use: Never used  Substance Use Topics   Alcohol use: Never   Drug use: Not Currently    Types: Marijuana     Allergies   Tilactase   Review of Systems Review of Systems   Physical Exam Triage Vital Signs ED Triage Vitals  Enc Vitals Group     BP 01/20/22 1516 110/73     Pulse Rate 01/20/22 1516 76     Resp 01/20/22 1516 16     Temp 01/20/22 1516 99.1 F (37.3 C)     Temp src --      SpO2 01/20/22 1516 97 %     Weight --      Height --      Head Circumference --      Peak Flow --      Pain Score 01/20/22 1518 0     Pain Loc --      Pain Edu? --      Excl. in GC? --    No data found.  Updated Vital Signs BP 110/73 (BP Location: Right Arm)   Pulse 76   Temp 99.1 F (37.3 C)   Resp 16   LMP 12/29/2021 (Exact Date)   SpO2 97%   Visual Acuity Right Eye Distance:   Left Eye Distance:  Bilateral Distance:    Right Eye Near:   Left Eye Near:    Bilateral Near:     Physical Exam Vitals reviewed.  Constitutional:      General: She is not in acute distress.    Appearance: She is not ill-appearing, toxic-appearing or diaphoretic.  Abdominal:     Palpations: Abdomen is soft.     Tenderness: There is no abdominal tenderness.  Genitourinary:    Comments: On exam the external perineum.  There is a possible swelling about 1 cm in diameter on her right labia minora.  Possibly mildly tender.  No fluctuance.  No ulcerations are seen Neurological:     Mental Status: She is alert and oriented to person, place, and time.  Psychiatric:        Behavior: Behavior normal.      UC Treatments / Results  Labs (all labs ordered are listed, but only abnormal results are displayed) Labs Reviewed  POC URINE PREG, ED    EKG   Radiology No results found.  Procedures Procedures (including critical care time)  Medications Ordered in UC Medications - No data to display  Initial Impression / Assessment and Plan / UC Course  I have reviewed the triage  vital signs and the nursing notes.  Pertinent labs & imaging results that were available during my care of the patient were reviewed by me and considered in my medical decision making (see chart for details).     I am going to treat for folliculitis with her having the bump in the tenderness.  It is not consistent at all with herpes, and so I am not going to do testing for that.  She requested testing for pregnancy after her exam was done. UPT is negative Final Clinical Impressions(s) / UC Diagnoses   Final diagnoses:  Cellulitis of perineum     Discharge Instructions      Your pregnancy test was negative.  Take amoxicillin-clavulanate 875 mg--1 tab twice daily with food for 7 days  You can do a warm sitz bath to increase circulation to the area to aid healing.  You can use the QR code/website at the back of the summary paperwork to schedule yourself a new patient appointment with primary care       ED Prescriptions     Medication Sig Dispense Auth. Provider   amoxicillin-clavulanate (AUGMENTIN) 875-125 MG tablet Take 1 tablet by mouth 2 (two) times daily for 7 days. 14 tablet Seth Higginbotham, Janace Aris, MD      PDMP not reviewed this encounter.   Zenia Resides, MD 01/20/22 [phone]

## 2022-01-20 NOTE — Discharge Instructions (Signed)
Your pregnancy test was negative.  Take amoxicillin-clavulanate 875 mg--1 tab twice daily with food for 7 days  You can do a warm sitz bath to increase circulation to the area to aid healing.  You can use the QR code/website at the back of the summary paperwork to schedule yourself a new patient appointment with primary care
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
## 2023-09-06 ENCOUNTER — Ambulatory Visit: Payer: Self-pay

## 2023-09-29 DIAGNOSIS — Z708 Other sex counseling: Secondary | ICD-10-CM | POA: Diagnosis not present

## 2023-10-13 DIAGNOSIS — F319 Bipolar disorder, unspecified: Secondary | ICD-10-CM | POA: Diagnosis not present

## 2023-10-27 DIAGNOSIS — F319 Bipolar disorder, unspecified: Secondary | ICD-10-CM | POA: Diagnosis not present

## 2023-10-28 DIAGNOSIS — F319 Bipolar disorder, unspecified: Secondary | ICD-10-CM | POA: Diagnosis not present

## 2023-11-15 DIAGNOSIS — R109 Unspecified abdominal pain: Secondary | ICD-10-CM | POA: Diagnosis not present

## 2023-11-15 DIAGNOSIS — W448XXA Other foreign body entering into or through a natural orifice, initial encounter: Secondary | ICD-10-CM | POA: Diagnosis not present

## 2023-11-15 DIAGNOSIS — T192XXA Foreign body in vulva and vagina, initial encounter: Secondary | ICD-10-CM | POA: Diagnosis not present

## 2024-02-20 DIAGNOSIS — Z0000 Encounter for general adult medical examination without abnormal findings: Secondary | ICD-10-CM | POA: Diagnosis not present

## 2024-02-20 DIAGNOSIS — G4700 Insomnia, unspecified: Secondary | ICD-10-CM | POA: Diagnosis not present

## 2024-03-09 DIAGNOSIS — K581 Irritable bowel syndrome with constipation: Secondary | ICD-10-CM | POA: Diagnosis not present

## 2024-03-09 DIAGNOSIS — N390 Urinary tract infection, site not specified: Secondary | ICD-10-CM | POA: Diagnosis not present

## 2024-04-24 ENCOUNTER — Ambulatory Visit
Admission: RE | Admit: 2024-04-24 | Discharge: 2024-04-24 | Disposition: A | Discharge status: Home / Self Care | Source: Ambulatory Visit | Attending: Emergency Medicine | Admitting: Emergency Medicine

## 2024-04-24 VITALS — BP 122/75 | HR 64 | Temp 98.2°F | Resp 16 | Ht 63.0 in | Wt 135.0 lb

## 2024-04-24 DIAGNOSIS — R300 Dysuria: Secondary | ICD-10-CM | POA: Diagnosis not present

## 2024-04-24 LAB — POCT URINE DIPSTICK
Bilirubin, UA: NEGATIVE
Blood, UA: NEGATIVE
Glucose, UA: NEGATIVE mg/dL
Leukocytes, UA: NEGATIVE
Nitrite, UA: NEGATIVE
Spec Grav, UA: >=1.03 — AB (ref 1.010–1.025)
Urobilinogen, UA: 1 U/dL
pH, UA: 7 (ref 5.0–8.0)

## 2024-04-24 MED ORDER — NITROFURANTOIN MONOHYD MACRO 100 MG PO CAPS: 100.0000 mg | ORAL_CAPSULE | Freq: Two times a day (BID) | ORAL | 0 refills | Status: AC

## 2024-04-24 NOTE — ED Provider Notes (Signed)
 Leslie Stanley    CSN: [id number] Arrival date & time: 04/24/24  1805      History   Chief Complaint Chief Complaint  Patient presents with   SEXUALLY TRANSMITTED DISEASE    I would like an STD/STI screening. I may have had recent exposure to STI. - Entered by patient   Dysuria    HPI Leslie Stanley is a 23 y.o. female.   Patient presents for evaluation of urinary frequency, an uncomfortable feeling with urination present for 3 weeks.  Was evaluated in a different urgent care and prescribed a medication, unsure of the name and endorses no improvement.  Then had sexual encounter approximately 2 weeks later, denies vaginal discharge itching or odor.  Requesting STI testing.  Denies hematuria, flank pain or fever.  Past Medical History:  Diagnosis Date   Anxiety    Depression    Migraine 2017    Patient Active Problem List   Diagnosis Date Noted   Accidental drug overdose 05/21/2020   MDD (major depressive disorder), severe (HCC) 03/20/2018    History reviewed. No pertinent surgical history.  OB History   No obstetric history on file.      Home Medications    Prior to Admission medications   Medication Sig Start Date End Date Taking? Authorizing Provider  nitrofurantoin, macrocrystal-monohydrate, (MACROBID) 100 MG capsule Take 1 capsule (100 mg total) by mouth 2 (two) times daily. 04/24/24  Yes Calin Fantroy, Shelba SAUNDERS, NP  traZODone  (DESYREL ) 50 MG tablet Take 1 tablet (50 mg total) by mouth at bedtime and may repeat dose one time if needed. Patient taking differently: Take 50 mg by mouth as needed for sleep. 03/23/18   Money, Caron NOVAK, FNP    Family History History reviewed. No pertinent family history.  Social History Social History   Tobacco Use   Smoking status: Never   Smokeless tobacco: Never   Tobacco comments:    no cessation material needed  Vaping Use   Vaping status: Never Used  Substance Use Topics   Alcohol use: Never   Drug use: Not Currently     Types: Marijuana     Allergies   Tilactase   Review of Systems Review of Systems   Physical Exam Triage Vital Signs ED Triage Vitals  Encounter Vitals Group     BP 04/24/24 1839 122/75     Girls Systolic BP Percentile --      Girls Diastolic BP Percentile --      Boys Systolic BP Percentile --      Boys Diastolic BP Percentile --      Pulse Rate 04/24/24 1839 64     Resp 04/24/24 1839 16     Temp 04/24/24 1839 98.2 F (36.8 C)     Temp Source 04/24/24 1839 Oral     SpO2 04/24/24 1839 99 %     Weight 04/24/24 1835 135 lb (61.2 kg)     Height 04/24/24 1835 5' 3 (1.6 m)     Head Circumference --      Peak Flow --      Pain Score 04/24/24 1835 4     Pain Loc --      Pain Education --      Exclude from Growth Chart --    No data found.  Updated Vital Signs BP 122/75 (BP Location: Left Arm)   Pulse 64   Temp 98.2 F (36.8 C) (Oral)   Resp 16   Ht 5' 3 (1.6 m)  Wt 135 lb (61.2 kg)   LMP 04/15/2024 (Exact Date)   SpO2 99%   BMI 23.91 kg/m   Visual Acuity Right Eye Distance:   Left Eye Distance:   Bilateral Distance:    Right Eye Near:   Left Eye Near:    Bilateral Near:     Physical Exam Constitutional:      Appearance: Normal appearance.  Eyes:     Extraocular Movements: Extraocular movements intact.  Abdominal:     Tenderness: There is no abdominal tenderness. There is no right CVA tenderness, left CVA tenderness or guarding.  Genitourinary:    Comments: deferred Neurological:     Mental Status: She is alert and oriented to person, place, and time.      UC Treatments / Results  Labs (all labs ordered are listed, but only abnormal results are displayed) Labs Reviewed  RPR  HIV ANTIBODY (ROUTINE TESTING W REFLEX)  POCT URINE DIPSTICK  CERVICOVAGINAL ANCILLARY ONLY    EKG   Radiology No results found.  Procedures Procedures (including critical care time)  Medications Ordered in UC Medications - No data to display  Initial  Impression / Assessment and Plan / UC Course  I have reviewed the triage vital signs and the nursing notes.  Pertinent labs & imaging results that were available during my care of the patient were reviewed by me and considered in my medical decision making (see chart for details).  Dysuria   Urinalysis negative, sent for culture empirically placed on Macrobid if she is symptomatic, unable to review visit note for recent urgent care visit, STI labs pending will treat per protocol, advised abstinence until lab results, and/or treatment is complete, advised condom use during all sexual encounters moving, may follow-up with urgent care as needed   Final Clinical Impressions(s) / UC Diagnoses   Final diagnoses:  Dysuria     Discharge Instructions      Your urinalysis does not show infection , your urine will be sent to the lab to determine exactly which bacteria is present, if any changes need to be made to your medications you will be notified  Begin use of Macrobid twice daily for 5 days  You may use over-the-counter Azo to help minimize your symptoms until antibiotic removes bacteria, this medication will turn your urine orange  Increase your fluid intake through use of water  As always practice good hygiene, wiping front to back and avoidance of scented vaginal products to prevent further irritation  If symptoms continue to persist after use of medication or recur please follow-up with urgent care or your primary doctor as needed  Labs pending 2-3 days, you will be contacted if positive for any sti and treatment will be sent to the pharmacy, you will have to return to the clinic if positive for gonorrhea to receive treatment   Please refrain from having sex until labs results, if positive please refrain from having sex until treatment complete and symptoms resolve   If positive for HIV, Syphilis, Chlamydia  gonorrhea or trichomoniasis please notify partner or partners so they may  tested as well  it is recommended you use some form of protection against the transmission of sti infections  such as condoms or dental dams with each sexual encounter      ED Prescriptions     Medication Sig Dispense Auth. Provider   nitrofurantoin, macrocrystal-monohydrate, (MACROBID) 100 MG capsule Take 1 capsule (100 mg total) by mouth 2 (two) times daily. 10 capsule Danilynn Jemison,  Shelba SAUNDERS, NP      PDMP not reviewed this encounter.   Teresa Shelba SAUNDERS, NP 04/24/24 [phone]

## 2024-04-24 NOTE — Discharge Instructions (Addendum)
 Your urinalysis does not show infection , your urine will be sent to the lab to determine exactly which bacteria is present, if any changes need to be made to your medications you will be notified  Begin use of Macrobid twice daily for 5 days  You may use over-the-counter Azo to help minimize your symptoms until antibiotic removes bacteria, this medication will turn your urine orange  Increase your fluid intake through use of water  As always practice good hygiene, wiping front to back and avoidance of scented vaginal products to prevent further irritation  If symptoms continue to persist after use of medication or recur please follow-up with urgent care or your primary doctor as needed  Labs pending 2-3 days, you will be contacted if positive for any sti and treatment will be sent to the pharmacy, you will have to return to the clinic if positive for gonorrhea to receive treatment   Please refrain from having sex until labs results, if positive please refrain from having sex until treatment complete and symptoms resolve   If positive for HIV, Syphilis, Chlamydia  gonorrhea or trichomoniasis please notify partner or partners so they may tested as well  it is recommended you use some form of protection against the transmission of sti infections  such as condoms or dental dams with each sexual encounter

## 2024-04-24 NOTE — ED Triage Notes (Addendum)
 Patient requesting STI testing including bloodwork, states she had unprotected sex a few weeks ago, some urinary discomfort as well.

## 2024-04-26 ENCOUNTER — Ambulatory Visit (HOSPITAL_COMMUNITY): Payer: Self-pay

## 2024-04-26 LAB — CERVICOVAGINAL ANCILLARY ONLY
Bacterial Vaginitis (gardnerella): POSITIVE — AB
Candida Glabrata: NEGATIVE
Candida Vaginitis: NEGATIVE
Chlamydia: NEGATIVE
Comment: NEGATIVE
Comment: NEGATIVE
Comment: NEGATIVE
Comment: NEGATIVE
Comment: NEGATIVE
Comment: NORMAL
Neisseria Gonorrhea: NEGATIVE
Trichomonas: NEGATIVE

## 2024-04-26 LAB — RPR: RPR Ser Ql: NONREACTIVE

## 2024-04-26 LAB — URINE CULTURE

## 2024-04-26 LAB — HIV ANTIBODY (ROUTINE TESTING W REFLEX): HIV Screen 4th Generation wRfx: NONREACTIVE

## 2024-04-26 MED ORDER — METRONIDAZOLE 500 MG PO TABS: 500.0000 mg | ORAL_TABLET | Freq: Two times a day (BID) | ORAL | 0 refills | Status: AC

## 2024-07-11 ENCOUNTER — Ambulatory Visit (HOSPITAL_COMMUNITY): Payer: Self-pay

## 2024-07-11 ENCOUNTER — Ambulatory Visit (INDEPENDENT_AMBULATORY_CARE_PROVIDER_SITE_OTHER): Admitting: Radiology

## 2024-07-11 ENCOUNTER — Ambulatory Visit
Admission: EM | Admit: 2024-07-11 | Discharge: 2024-07-11 | Disposition: A | Discharge status: Home / Self Care | Attending: Physician Assistant | Admitting: Physician Assistant

## 2024-07-11 DIAGNOSIS — M79645 Pain in left finger(s): Secondary | ICD-10-CM | POA: Diagnosis not present

## 2024-07-11 DIAGNOSIS — Z3202 Encounter for pregnancy test, result negative: Secondary | ICD-10-CM
